# Patient Record
Sex: Female | Born: 1985
Health system: Southern US, Community
[De-identification: ages and names within clinical notes are randomized; demographics above are authoritative.]

## PROBLEM LIST (undated history)

## (undated) DIAGNOSIS — F419 Anxiety disorder, unspecified: Secondary | ICD-10-CM

## (undated) DIAGNOSIS — R002 Palpitations: Secondary | ICD-10-CM

## (undated) DIAGNOSIS — R079 Chest pain, unspecified: Secondary | ICD-10-CM

## (undated) DIAGNOSIS — F410 Panic disorder [episodic paroxysmal anxiety] without agoraphobia: Secondary | ICD-10-CM

## (undated) DIAGNOSIS — E876 Hypokalemia: Secondary | ICD-10-CM

## (undated) HISTORY — DX: Chest pain, unspecified: R07.9

## (undated) HISTORY — DX: Palpitations: R00.2

## (undated) HISTORY — DX: Hypokalemia: E87.6

---
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
## 2005-09-01 ENCOUNTER — Emergency Department: Payer: Self-pay | Admitting: Internal Medicine

## 2005-09-03 ENCOUNTER — Emergency Department: Payer: Self-pay | Admitting: Emergency Medicine

## 2005-09-04 ENCOUNTER — Ambulatory Visit: Payer: Self-pay | Admitting: Emergency Medicine

## 2005-12-16 ENCOUNTER — Emergency Department: Payer: Self-pay | Admitting: Emergency Medicine

## 2006-05-08 ENCOUNTER — Observation Stay: Payer: Self-pay | Admitting: Obstetrics & Gynecology

## 2006-08-15 ENCOUNTER — Observation Stay: Payer: Self-pay

## 2006-08-17 ENCOUNTER — Inpatient Hospital Stay: Payer: Self-pay

## 2007-01-15 LAB — HM HEPATITIS C SCREENING LAB: HM Hepatitis Screen: NEGATIVE

## 2008-02-13 ENCOUNTER — Emergency Department (HOSPITAL_COMMUNITY): Admission: EM | Admit: 2008-02-13 | Discharge: 2008-02-13 | Payer: Self-pay | Admitting: Emergency Medicine

## 2008-02-17 ENCOUNTER — Emergency Department: Payer: Self-pay | Admitting: Emergency Medicine

## 2009-02-01 ENCOUNTER — Emergency Department: Payer: Self-pay | Admitting: Emergency Medicine

## 2009-02-28 ENCOUNTER — Ambulatory Visit: Payer: Self-pay | Admitting: General Practice

## 2009-05-01 ENCOUNTER — Emergency Department: Payer: Self-pay | Admitting: Emergency Medicine

## 2009-08-26 ENCOUNTER — Emergency Department (HOSPITAL_COMMUNITY): Admission: EM | Admit: 2009-08-26 | Discharge: 2009-08-26 | Payer: Self-pay | Admitting: Emergency Medicine

## 2010-12-12 ENCOUNTER — Ambulatory Visit (INDEPENDENT_AMBULATORY_CARE_PROVIDER_SITE_OTHER): Payer: BC Managed Care – PPO | Admitting: Family Medicine

## 2010-12-12 ENCOUNTER — Encounter: Payer: Self-pay | Admitting: Family Medicine

## 2010-12-12 VITALS — BP 92/50 | HR 78 | Temp 98.2°F | Ht 66.0 in | Wt 190.5 lb

## 2010-12-12 DIAGNOSIS — R631 Polydipsia: Secondary | ICD-10-CM

## 2010-12-12 DIAGNOSIS — M62838 Other muscle spasm: Secondary | ICD-10-CM | POA: Insufficient documentation

## 2010-12-12 DIAGNOSIS — R350 Frequency of micturition: Secondary | ICD-10-CM | POA: Insufficient documentation

## 2010-12-12 DIAGNOSIS — R5383 Other fatigue: Secondary | ICD-10-CM

## 2010-12-12 DIAGNOSIS — Z0000 Encounter for general adult medical examination without abnormal findings: Secondary | ICD-10-CM

## 2010-12-12 DIAGNOSIS — R5381 Other malaise: Secondary | ICD-10-CM

## 2010-12-12 DIAGNOSIS — R102 Pelvic and perineal pain: Secondary | ICD-10-CM

## 2010-12-12 DIAGNOSIS — R109 Unspecified abdominal pain: Secondary | ICD-10-CM

## 2010-12-12 LAB — POCT URINALYSIS DIPSTICK
Bilirubin, UA: NEGATIVE
Blood, UA: NEGATIVE
Glucose, UA: NEGATIVE
Spec Grav, UA: 1.015

## 2010-12-12 MED ORDER — CYCLOBENZAPRINE HCL 5 MG PO TABS: 5.0000 mg | ORAL_TABLET | Freq: Three times a day (TID) | ORAL | Status: DC | PRN

## 2010-12-12 NOTE — Progress Notes (Signed)
Subjective:    Patient ID: Jillian Contreras, female    DOB: [birth year], 25 y.o.   MRN: [id number]  HPI  25 yo here to establish care with:  1.  Left sided neck/back and arm pain- no known injury.   Recently changed jobs.  Was a CNA, now working in Clinical biochemist at [employer].  Not used to working at a computer all day. No UE weakness.  No radiculopathy.  2.  Increased thirst/urination- at one point told she was prediabetic.  Has been working on diet.  Not sure if her parents have diabetes and they do not go to doctor often. No dysuria but she is having bilateral low back pain and some suprapubic pain. No vaginal discharge or vulvular irritation.   Has not been sexually active since March.    Review of Systems See HPI Patient reports no  vision/ hearing changes,anorexia, weight change, fever ,adenopathy, persistant / recurrent hoarseness, swallowing issues, chest pain, edema,persistant / recurrent cough, hemoptysis, dyspnea(rest, exertional, paroxysmal nocturnal), gastrointestinal  bleeding (melena, rectal bleeding), abdominal pain, excessive heart burn, GU symptoms(dysuria, hematuria, pyuria, voiding/incontinence  Issues) syncope, focal weakness, severe memory loss, concerning skin lesions, depression, anxiety, abnormal bruising/bleeding, major joint swelling, breast masses or abnormal vaginal bleeding.       Objective:   Physical Exam BP 92/50  Pulse 78  Temp(Src) 98.2 F (36.8 C) (Oral)  Ht 5\' 6"  (1.676 m)  Wt 190 lb 8 oz (86.41 kg)  BMI 30.75 kg/m2  LMP 12/01/2010  General:  Well-developed,well-nourished,in no acute distress; alert,appropriate and cooperative throughout examination Head:  normocephalic and atraumatic.   Eyes:  vision grossly intact, pupils equal, pupils round, and pupils reactive to light.   Ears:  R ear normal and L ear normal.   Nose:  no external deformity.   Mouth:  good dentition.   Neck:  No deformities, left trapezius tightness, FROM, no  masses or tenderness Lungs:  Normal respiratory effort, chest expands symmetrically. Lungs are clear to auscultation, no crackles or wheezes. Heart:  Normal rate and regular rhythm. S1 and S2 normal without gallop, murmur, click, rub or other extra sounds. Abdomen:  Bowel sounds positive,abdomen soft and non-tender without masses, organomegaly or hernias noted. Rectal:  no external abnormalities.   Genitalia:  Pelvic Exam:        External: normal female genitalia without lesions or masses        Vagina: normal without lesions or masses        Cervix: normal without lesions or masses        Adnexa: normal bimanual exam without masses or fullness        Uterus: normal by palpation      Msk:  No deformity or scoliosis noted of thoracic or lumbar spine.   Extremities:  No clubbing, cyanosis, edema, or deformity noted with normal full range of motion of all joints.   Neurologic:  alert & oriented X3 and gait normal.   Skin:  Intact without suspicious lesions or rashes Psych:  Cognition and judgment appear intact. Alert and cooperative with normal attention span and concentration. No apparent delusions, illusions, hallucinations    Assessment & Plan:   1. Trapezius muscle spasm   New.  Discussed stretches, try flexeril as needed.     2. Urinary frequency   UA negative.  Will check a1c, BMET today. HgB A1c, POCT urinalysis dipstick  3. Suprapubic pain   New and resolved.  UA neg, wet prep negative and no  tenderness with pelvic exam.  Will send GC/chlamydia probe. GC/chlamydia probe amp, genital  4. Fatigue   Likely related to job and life stressors, single mom of a 49 year old.   Will also check TSH and CBC to rule out other possible contributing factors.    TSH, CBC

## 2010-12-12 NOTE — Patient Instructions (Signed)
Good to meet you. We will call you with your lab results next week. Try the flexeril at night to see if that helps with your neck and back pain.

## 2010-12-12 NOTE — Progress Notes (Signed)
Addended by: Dianne Dun on: 12/12/2010 03:04 PM   Modules accepted: Orders

## 2010-12-13 LAB — GC/CHLAMYDIA PROBE AMP, GENITAL
Chlamydia, DNA Probe: NEGATIVE
GC Probe Amp, Genital: NEGATIVE

## 2010-12-13 LAB — HEMOGLOBIN A1C: Mean Plasma Glucose: 128 mg/dL — ABNORMAL HIGH (ref <117)

## 2010-12-13 LAB — BASIC METABOLIC PANEL
CO2: 23 mEq/L (ref 19–32)
Calcium: 8.7 mg/dL (ref 8.4–10.5)
Creat: 0.67 mg/dL (ref 0.50–1.10)
Glucose, Bld: 104 mg/dL — ABNORMAL HIGH (ref 70–99)
Sodium: 138 mEq/L (ref 135–145)

## 2010-12-13 LAB — TSH: TSH: 1.912 u[IU]/mL (ref 0.350–4.500)

## 2010-12-19 LAB — CBC WITH DIFFERENTIAL/PLATELET

## 2010-12-23 ENCOUNTER — Other Ambulatory Visit (INDEPENDENT_AMBULATORY_CARE_PROVIDER_SITE_OTHER): Payer: BC Managed Care – PPO

## 2010-12-23 DIAGNOSIS — R5381 Other malaise: Secondary | ICD-10-CM

## 2010-12-23 DIAGNOSIS — R5383 Other fatigue: Secondary | ICD-10-CM

## 2010-12-23 DIAGNOSIS — Z0000 Encounter for general adult medical examination without abnormal findings: Secondary | ICD-10-CM

## 2010-12-23 LAB — CBC WITH DIFFERENTIAL/PLATELET
Basophils Absolute: 0 10*3/uL (ref 0.0–0.1)
Basophils Relative: 0.3 % (ref 0.0–3.0)
Eosinophils Absolute: 0.1 10*3/uL (ref 0.0–0.7)
HCT: 35.5 % — ABNORMAL LOW (ref 36.0–46.0)
Hemoglobin: 11.8 g/dL — ABNORMAL LOW (ref 12.0–15.0)
Lymphs Abs: 2.4 10*3/uL (ref 0.7–4.0)
MCHC: 33.3 g/dL (ref 30.0–36.0)
Neutro Abs: 4.2 10*3/uL (ref 1.4–7.7)
RBC: 4.2 Mil/uL (ref 3.87–5.11)
RDW: 13.5 % (ref 11.5–14.6)

## 2010-12-23 LAB — COMPREHENSIVE METABOLIC PANEL
Albumin: 3.9
Alkaline Phosphatase: 93
BUN: 8
Calcium: 9.5
Potassium: 4.5
Sodium: 139
Total Protein: 6.7

## 2010-12-23 LAB — URINALYSIS, ROUTINE W REFLEX MICROSCOPIC
Bilirubin Urine: NEGATIVE
Nitrite: NEGATIVE
Protein, ur: NEGATIVE
Specific Gravity, Urine: 1.016
Urobilinogen, UA: 0.2

## 2010-12-23 LAB — CBC
HCT: 39.4
MCHC: 33.2
Platelets: 293
RDW: 12.6

## 2010-12-23 LAB — DIFFERENTIAL
Basophils Relative: 0
Lymphocytes Relative: 25
Lymphs Abs: 2.4
Monocytes Absolute: 0.5
Monocytes Relative: 5
Neutro Abs: 6.6
Neutrophils Relative %: 68

## 2010-12-23 LAB — POCT PREGNANCY, URINE: Preg Test, Ur: NEGATIVE

## 2011-04-23 ENCOUNTER — Encounter: Payer: Self-pay | Admitting: Family Medicine

## 2011-04-23 ENCOUNTER — Encounter: Payer: Self-pay | Admitting: *Deleted

## 2011-04-23 ENCOUNTER — Ambulatory Visit (INDEPENDENT_AMBULATORY_CARE_PROVIDER_SITE_OTHER): Payer: BC Managed Care – PPO | Admitting: Family Medicine

## 2011-04-23 VITALS — BP 120/70 | HR 87 | Temp 98.2°F | Wt 188.5 lb

## 2011-04-23 DIAGNOSIS — E162 Hypoglycemia, unspecified: Secondary | ICD-10-CM

## 2011-04-23 LAB — BASIC METABOLIC PANEL
BUN: 7 mg/dL (ref 6–23)
Calcium: 9.3 mg/dL (ref 8.4–10.5)
Creatinine, Ser: 0.7 mg/dL (ref 0.4–1.2)
GFR: 124.96 mL/min (ref >60.00)

## 2011-04-23 LAB — HEMOGLOBIN A1C: Hgb A1c MFr Bld: 5.8 % (ref 4.6–6.5)

## 2011-04-23 NOTE — Patient Instructions (Signed)
Good to see you. I will call you with your lab results tomorrow or Monday.

## 2011-04-23 NOTE — Progress Notes (Signed)
  Subjective:    Patient ID: Jillian Contreras, female    DOB: 18-Jul-1985, 26 y.o.   MRN: [id number]  HPI  26 yo here to "check if I have diabetes."  a1c was 6.1 in 11/2010.  For past several months, feels like her blood sugar is dropping. Gets very shaky.  When she eats or drinks something, feels better but very fatigued.  Not sure if she has a family h/o DM. Patient Active Problem List  Diagnoses  . Trapezius muscle spasm  . Urinary frequency  . Suprapubic pain  . Increased thirst  . Low blood sugar   No past medical history on file. Past Surgical History  Procedure Date  . Cesarean section    History  Substance Use Topics  . Smoking status: Never Smoker   . Smokeless tobacco: Not on file  . Alcohol Use: Not on file   No family history on file. No Known Allergies Current Outpatient Prescriptions on File Prior to Visit  Medication Sig Dispense Refill  . cyclobenzaprine (FLEXERIL) 5 MG tablet Take 1 tablet (5 mg total) by mouth every 8 (eight) hours as needed for muscle spasms.  30 tablet  1   The PMH, PSH, Social History, Family History, Medications, and allergies have been reviewed in [facility], and have been updated if relevant.  Review of Systems See HPI No increased urinary frequency or increased thirst    Objective:   Physical Exam BP 120/70  Pulse 87  Temp(Src) 98.2 F (36.8 C) (Oral)  Wt 188 lb 8 oz (85.503 kg)  LMP 04/12/2010  General:  Well-developed,well-nourished,in no acute distress; alert,appropriate and cooperative throughout examination Head:  normocephalic and atraumatic.   Lungs:  Normal respiratory effort, chest expands symmetrically. Lungs are clear to auscultation, no crackles or wheezes. Heart:  Normal rate and regular rhythm. S1 and S2 normal without gallop, murmur, click, rub or other extra sounds. Abdomen:  Bowel sounds positive,abdomen soft and non-tender without masses, organomegaly or hernias noted. Msk:  No deformity or scoliosis noted of  thoracic or lumbar spine.   Extremities:  No clubbing, cyanosis, edema, or deformity noted with normal full range of motion of all joints.   Neurologic:  alert & oriented X3 and gait normal.   Skin:  Intact without suspicious lesions or rashes Psych:  Cognition and judgment appear intact. Alert and cooperative with normal attention span and concentration. No apparent delusions, illusions, hallucinations    Assessment & Plan:   1. Low blood sugar  HgB A1c, Basic Metabolic Panel (BMET)   Deteriorated. Recheck labs today. The patient indicates understanding of these issues and agrees with the plan.

## 2011-04-28 ENCOUNTER — Encounter: Payer: Self-pay | Admitting: Family Medicine

## 2011-04-28 ENCOUNTER — Ambulatory Visit (INDEPENDENT_AMBULATORY_CARE_PROVIDER_SITE_OTHER): Payer: BC Managed Care – PPO | Admitting: Family Medicine

## 2011-04-28 VITALS — BP 110/76 | HR 66 | Temp 98.4°F | Wt 187.8 lb

## 2011-04-28 DIAGNOSIS — J069 Acute upper respiratory infection, unspecified: Secondary | ICD-10-CM

## 2011-04-28 MED ORDER — HYDROCOD POLST-CHLORPHEN POLST 10-8 MG/5ML PO LQCR: 5.0000 mL | Freq: Every evening | ORAL | Status: DC | PRN

## 2011-04-28 MED ORDER — GUAIFENESIN ER 600 MG PO TB12: 600.0000 mg | ORAL_TABLET | Freq: Two times a day (BID) | ORAL | Status: DC

## 2011-04-28 NOTE — Progress Notes (Signed)
SUBJECTIVE:  Jillian Contreras is a 26 y.o. female who complains of coryza, congestion, dry cough and hoarseness for 3 days. She denies a history of anorexia, chest pain, chills, fevers, nausea, sweats and vomiting and denies a history of asthma. Patient denies smoke cigarettes.   Patient Active Problem List  Diagnoses  . Trapezius muscle spasm  . Urinary frequency  . Suprapubic pain  . Increased thirst  . Low blood sugar   No past medical history on file. Past Surgical History  Procedure Date  . Cesarean section    History  Substance Use Topics  . Smoking status: Never Smoker   . Smokeless tobacco: Not on file  . Alcohol Use: Not on file   No family history on file. No Known Allergies No current outpatient prescriptions on file prior to visit.   The PMH, PSH, Social History, Family History, Medications, and allergies have been reviewed in [facility], and have been updated if relevant.  OBJECTIVE: BP 110/76  Pulse 66  Temp(Src) 98.4 F (36.9 C) (Oral)  Wt 187 lb 12 oz (85.163 kg)  LMP 04/12/2010  She appears well, vital signs are as noted. Ears normal.  Throat and pharynx normal.  Neck supple. No adenopathy in the neck. Nose is congested. Sinuses non tender. The chest is clear, without wheezes or rales.  ASSESSMENT:  viral upper respiratory illness  PLAN: Symptomatic therapy suggested: push fluids, rest and return office visit prn if symptoms persist or worsen. Lack of antibiotic effectiveness discussed with her. Call or return to clinic prn if these symptoms worsen or fail to improve as anticipated.

## 2011-04-28 NOTE — Patient Instructions (Signed)
Drink lots of fluids.  Treat sympotmatically with Mucinex, nasal saline irrigation, and Tylenol/Ibuprofen. A  Cough suppressant at night. Call if not improving as expected in 5-7 days.

## 2011-06-22 ENCOUNTER — Encounter: Payer: BC Managed Care – PPO | Admitting: Family Medicine

## 2011-06-23 ENCOUNTER — Encounter: Payer: BC Managed Care – PPO | Admitting: Family Medicine

## 2011-08-20 ENCOUNTER — Ambulatory Visit (INDEPENDENT_AMBULATORY_CARE_PROVIDER_SITE_OTHER): Payer: BC Managed Care – PPO | Admitting: Family Medicine

## 2011-08-20 ENCOUNTER — Other Ambulatory Visit (HOSPITAL_COMMUNITY)
Admission: RE | Admit: 2011-08-20 | Discharge: 2011-08-20 | Disposition: A | Discharge status: Home / Self Care | Payer: BC Managed Care – PPO | Source: Ambulatory Visit | Attending: Family Medicine | Admitting: Family Medicine

## 2011-08-20 ENCOUNTER — Encounter: Payer: Self-pay | Admitting: Family Medicine

## 2011-08-20 VITALS — BP 118/80 | HR 60 | Temp 98.0°F | Ht 65.25 in | Wt 180.0 lb

## 2011-08-20 DIAGNOSIS — Z0000 Encounter for general adult medical examination without abnormal findings: Secondary | ICD-10-CM | POA: Insufficient documentation

## 2011-08-20 DIAGNOSIS — Z01419 Encounter for gynecological examination (general) (routine) without abnormal findings: Secondary | ICD-10-CM | POA: Insufficient documentation

## 2011-08-20 LAB — COMPREHENSIVE METABOLIC PANEL
AST: 22 U/L (ref 0–37)
Albumin: 4.1 g/dL (ref 3.5–5.2)
Alkaline Phosphatase: 70 U/L (ref 39–117)
Potassium: 3.8 mEq/L (ref 3.5–5.1)
Sodium: 139 mEq/L (ref 135–145)
Total Bilirubin: 0.5 mg/dL (ref 0.3–1.2)
Total Protein: 7.6 g/dL (ref 6.0–8.3)

## 2011-08-20 LAB — LDL CHOLESTEROL, DIRECT: Direct LDL: 149.6 mg/dL

## 2011-08-20 LAB — CBC WITH DIFFERENTIAL/PLATELET
Basophils Absolute: 0 10*3/uL (ref 0.0–0.1)
Eosinophils Relative: 1.4 % (ref 0.0–5.0)
HCT: 37.2 % (ref 36.0–46.0)
Hemoglobin: 12.3 g/dL (ref 12.0–15.0)
Lymphocytes Relative: 34.6 % (ref 12.0–46.0)
Lymphs Abs: 2 10*3/uL (ref 0.7–4.0)
Monocytes Relative: 6.4 % (ref 3.0–12.0)
Neutro Abs: 3.3 10*3/uL (ref 1.4–7.7)
Platelets: 286 10*3/uL (ref 150.0–400.0)
RDW: 13.8 % (ref 11.5–14.6)
WBC: 5.7 10*3/uL (ref 4.5–10.5)

## 2011-08-20 LAB — LIPID PANEL
Total CHOL/HDL Ratio: 4
VLDL: 16.4 mg/dL (ref 0.0–40.0)

## 2011-08-20 NOTE — Patient Instructions (Signed)
Great to see you. We will call you with your lab results and likely send you a letter with your pap smear results.  Health Maintenance, Females A healthy lifestyle and preventative care can promote health and wellness.  Maintain regular health, dental, and eye exams.   Eat a healthy diet. Foods like vegetables, fruits, whole grains, low-fat dairy products, and lean protein foods contain the nutrients you need without too many calories. Decrease your intake of foods high in solid fats, added sugars, and salt. Get information about a proper diet from your caregiver, if necessary.   Regular physical exercise is one of the most important things you can do for your health. Most adults should get at least 150 minutes of moderate-intensity exercise (any activity that increases your heart rate and causes you to sweat) each week. In addition, most adults need muscle-strengthening exercises on 2 or more days a week.    Maintain a healthy weight. The body mass index (BMI) is a screening tool to identify possible weight problems. It provides an estimate of body fat based on height and weight. Your caregiver can help determine your BMI, and can help you achieve or maintain a healthy weight. For adults 20 years and older:   A BMI below 18.5 is considered underweight.   A BMI of 18.5 to 24.9 is normal.   A BMI of 25 to 29.9 is considered overweight.   A BMI of 30 and above is considered obese.   Maintain normal blood lipids and cholesterol by exercising and minimizing your intake of saturated fat. Eat a balanced diet with plenty of fruits and vegetables. Blood tests for lipids and cholesterol should begin at age 4 and be repeated every 5 years. If your lipid or cholesterol levels are high, you are over 50, or you are a high risk for heart disease, you may need your cholesterol levels checked more frequently.Ongoing high lipid and cholesterol levels should be treated with medicines if diet and exercise are  not effective.   If you smoke, find out from your caregiver how to quit. If you do not use tobacco, do not start.   If you are pregnant, do not drink alcohol. If you are breastfeeding, be very cautious about drinking alcohol. If you are not pregnant and choose to drink alcohol, do not exceed 1 drink per day. One drink is considered to be 12 ounces (355 mL) of beer, 5 ounces (148 mL) of wine, or 1.5 ounces (44 mL) of liquor.   Avoid use of street drugs. Do not share needles with anyone. Ask for help if you need support or instructions about stopping the use of drugs.   High blood pressure causes heart disease and increases the risk of stroke. Blood pressure should be checked at least every 1 to 2 years. Ongoing high blood pressure should be treated with medicines, if weight loss and exercise are not effective.   If you are 39 to 27 years old, ask your caregiver if you should take aspirin to prevent strokes.   Diabetes screening involves taking a blood sample to check your fasting blood sugar level. This should be done once every 3 years, after age 23, if you are within normal weight and without risk factors for diabetes. Testing should be considered at a younger age or be carried out more frequently if you are overweight and have at least 1 risk factor for diabetes.   Breast cancer screening is essential preventative care for women. You should practice "  breast self-awareness." This means understanding the normal appearance and feel of your breasts and may include breast self-examination. Any changes detected, no matter how small, should be reported to a caregiver. Women in their 23s and 30s should have a clinical breast exam (CBE) by a caregiver as part of a regular health exam every 1 to 3 years. After age 15, women should have a CBE every year. Starting at age 41, women should consider having a mammogram (breast X-ray) every year. Women who have a family history of breast cancer should talk to their  caregiver about genetic screening. Women at a high risk of breast cancer should talk to their caregiver about having an MRI and a mammogram every year.   The Pap test is a screening test for cervical cancer. Women should have a Pap test starting at age 25. Between ages 65 and 47, Pap tests should be repeated every 2 years. Beginning at age 81, you should have a Pap test every 3 years as long as the past 3 Pap tests have been normal. If you had a hysterectomy for a problem that was not cancer or a condition that could lead to cancer, then you no longer need Pap tests. If you are between ages 63 and 42, and you have had normal Pap tests going back 10 years, you no longer need Pap tests. If you have had past treatment for cervical cancer or a condition that could lead to cancer, you need Pap tests and screening for cancer for at least 20 years after your treatment. If Pap tests have been discontinued, risk factors (such as a new sexual partner) need to be reassessed to determine if screening should be resumed. Some women have medical problems that increase the chance of getting cervical cancer. In these cases, your caregiver may recommend more frequent screening and Pap tests.   The human papillomavirus (HPV) test is an additional test that may be used for cervical cancer screening. The HPV test looks for the virus that can cause the cell changes on the cervix. The cells collected during the Pap test can be tested for HPV. The HPV test could be used to screen women aged 76 years and older, and should be used in women of any age who have unclear Pap test results. After the age of 80, women should have HPV testing at the same frequency as a Pap test.   Colorectal cancer can be detected and often prevented. Most routine colorectal cancer screening begins at the age of 31 and continues through age 37. However, your caregiver may recommend screening at an earlier age if you have risk factors for colon cancer. On a  yearly basis, your caregiver may provide home test kits to check for hidden blood in the stool. Use of a small camera at the end of a tube, to directly examine the colon (sigmoidoscopy or colonoscopy), can detect the earliest forms of colorectal cancer. Talk to your caregiver about this at age 74, when routine screening begins. Direct examination of the colon should be repeated every 5 to 10 years through age 24, unless early forms of pre-cancerous polyps or small growths are found.   Hepatitis C blood testing is recommended for all people born from [birth year] through 1966 and any individual with known risks for hepatitis C.   Practice safe sex. Use condoms and avoid high-risk sexual practices to reduce the spread of sexually transmitted infections (STIs). Sexually active women aged 72 and younger should be checked for  Chlamydia, which is a common sexually transmitted infection. Older women with new or multiple partners should also be tested for Chlamydia. Testing for other STIs is recommended if you are sexually active and at increased risk.   Osteoporosis is a disease in which the bones lose minerals and strength with aging. This can result in serious bone fractures. The risk of osteoporosis can be identified using a bone density scan. Women ages 6 and over and women at risk for fractures or osteoporosis should discuss screening with their caregivers. Ask your caregiver whether you should be taking a calcium supplement or vitamin D to reduce the rate of osteoporosis.   Menopause can be associated with physical symptoms and risks. Hormone replacement therapy is available to decrease symptoms and risks. You should talk to your caregiver about whether hormone replacement therapy is right for you.   Use sunscreen with a sun protection factor (SPF) of 30 or greater. Apply sunscreen liberally and repeatedly throughout the day. You should seek shade when your shadow is shorter than you. Protect yourself by wearing  long sleeves, pants, a wide-brimmed hat, and sunglasses year round, whenever you are outdoors.   Notify your caregiver of new moles or changes in moles, especially if there is a change in shape or color. Also notify your caregiver if a mole is larger than the size of a pencil eraser.   Stay current with your immunizations.  Document Released: 09/22/2010 Document Revised: 02/26/2011 Document Reviewed: 09/22/2010 [facility] Patient Information 2012 [place], [place].

## 2011-08-20 NOTE — Progress Notes (Signed)
Subjective:    Patient ID: Jillian Contreras, female    DOB: [birth year], 26 y.o.   MRN: [id number]  HPI  26 yo here for CPX.  Has not been sexually active in over a year.  Doing well, trying to loose weight with diet and exercise.  Wt Readings from Last 3 Encounters:  08/20/11 180 lb (81.647 kg)  04/28/11 187 lb 12 oz (85.163 kg)  04/23/11 188 lb 8 oz (85.503 kg)   Patient Active Problem List  Diagnoses  . Trapezius muscle spasm  . Urinary frequency  . Suprapubic pain  . Increased thirst  . Low blood sugar  . Routine general medical examination at a health care facility  . Gynecological examination   No past medical history on file. Past Surgical History  Procedure Date  . Cesarean section    History  Substance Use Topics  . Smoking status: Never Smoker   . Smokeless tobacco: Not on file  . Alcohol Use: Not on file   Family History  Problem Relation Age of Onset  . Cancer Paternal Aunt 29    breast   No Known Allergies No current outpatient prescriptions on file prior to visit.   The PMH, PSH, Social History, Family History, Medications, and allergies have been reviewed in [facility], and have been updated if relevant.    Review of Systems    See HPI Patient reports no  vision/ hearing changes,anorexia, weight change, fever ,adenopathy, persistant / recurrent hoarseness, swallowing issues, chest pain, edema,persistant / recurrent cough, hemoptysis, dyspnea(rest, exertional, paroxysmal nocturnal), gastrointestinal  bleeding (melena, rectal bleeding), abdominal pain, excessive heart burn, GU symptoms(dysuria, hematuria, pyuria, voiding/incontinence  Issues) syncope, focal weakness, severe memory loss, concerning skin lesions, depression, anxiety, abnormal bruising/bleeding, major joint swelling, breast masses or abnormal vaginal bleeding.    Objective:   Physical Exam BP 118/80  Pulse 60  Temp(Src) 98 F (36.7 C) (Oral)  Ht 5' 5.25" (1.657 m)  Wt 180 lb (81.647 kg)   BMI 29.72 kg/m2  LMP 07/27/2011  General:  Well-developed,well-nourished,in no acute distress; alert,appropriate and cooperative throughout examination Head:  normocephalic and atraumatic.   Eyes:  vision grossly intact, pupils equal, pupils round, and pupils reactive to light.   Ears:  R ear normal and L ear normal.   Nose:  no external deformity.   Mouth:  good dentition.   Neck:  No deformities, masses, or tenderness noted. Breasts:  No mass, nodules, thickening, tenderness, bulging, retraction, inflamation, nipple discharge or skin changes noted.   Lungs:  Normal respiratory effort, chest expands symmetrically. Lungs are clear to auscultation, no crackles or wheezes. Heart:  Normal rate and regular rhythm. S1 and S2 normal without gallop, murmur, click, rub or other extra sounds. Abdomen:  Bowel sounds positive,abdomen soft and non-tender without masses, organomegaly or hernias noted. Rectal:  no external abnormalities.   Genitalia:  Pelvic Exam:        External: normal female genitalia without lesions or masses        Vagina: normal without lesions or masses        Cervix: normal without lesions or masses        Adnexa: normal bimanual exam without masses or fullness        Uterus: normal by palpation        Pap smear: performed Msk:  No deformity or scoliosis noted of thoracic or lumbar spine.   Extremities:  No clubbing, cyanosis, edema, or deformity noted with normal full range of motion  of all joints.   Neurologic:  alert & oriented X3 and gait normal.   Skin:  Intact without suspicious lesions or rashes Cervical Nodes:  No lymphadenopathy noted Axillary Nodes:  No palpable lymphadenopathy Psych:  Cognition and judgment appear intact. Alert and cooperative with normal attention span and concentration. No apparent delusions, illusions, hallucinations     Assessment & Plan:   1. Routine general medical examination at a health care facility  Reviewed preventive care  protocols, scheduled due services, and updated immunizations Discussed nutrition, exercise, diet, and healthy lifestyle.  Comprehensive metabolic panel, Lipid Panel, CBC with Differential, Cytology - PAP  2. Gynecological examination  Cytology - PAP

## 2011-08-24 ENCOUNTER — Encounter: Payer: Self-pay | Admitting: Family Medicine

## 2011-08-24 ENCOUNTER — Encounter: Payer: Self-pay | Admitting: *Deleted

## 2011-10-24 ENCOUNTER — Emergency Department: Payer: Self-pay | Admitting: Unknown Physician Specialty

## 2011-10-24 ENCOUNTER — Ambulatory Visit: Payer: Self-pay | Admitting: Internal Medicine

## 2011-10-24 LAB — COMPREHENSIVE METABOLIC PANEL
Alkaline Phosphatase: 104 U/L (ref 50–136)
Anion Gap: 11 (ref 7–16)
BUN: 10 mg/dL (ref 7–18)
Bilirubin,Total: 0.4 mg/dL (ref 0.2–1.0)
Chloride: 106 mmol/L (ref 98–107)
Co2: 23 mmol/L (ref 21–32)
Creatinine: 0.79 mg/dL (ref 0.60–1.30)
EGFR (African American): >60
EGFR (Non-African Amer.): >60
Potassium: 4 mmol/L (ref 3.5–5.1)
SGPT (ALT): 35 U/L (ref 12–78)
Total Protein: 8.4 g/dL — ABNORMAL HIGH (ref 6.4–8.2)

## 2011-10-24 LAB — CBC WITH DIFFERENTIAL/PLATELET
Basophil #: 0.1 10*3/uL (ref 0.0–0.1)
Eosinophil #: 0 10*3/uL (ref 0.0–0.7)
HCT: 35.9 % (ref 35.0–47.0)
Lymphocyte #: 2 10*3/uL (ref 1.0–3.6)
Lymphocyte %: 15.1 %
MCH: 27 pg (ref 26.0–34.0)
MCHC: 32.5 g/dL (ref 32.0–36.0)
MCV: 83 fL (ref 80–100)
Monocyte #: 1.3 x10 3/mm — ABNORMAL HIGH (ref 0.2–0.9)
Neutrophil #: 9.9 10*3/uL — ABNORMAL HIGH (ref 1.4–6.5)
Platelet: 315 10*3/uL (ref 150–440)
RDW: 13.9 % (ref 11.5–14.5)
WBC: 13.3 10*3/uL — ABNORMAL HIGH (ref 3.6–11.0)

## 2011-10-24 LAB — URINALYSIS, COMPLETE
Bacteria: NONE SEEN
Bilirubin,UR: NEGATIVE
Blood: NEGATIVE
Glucose,UR: NEGATIVE mg/dL (ref 0–75)
Ketone: NEGATIVE
Ph: 6 (ref 4.5–8.0)
Protein: 30
RBC,UR: 6 /HPF (ref 0–5)
Specific Gravity: 1.024 (ref 1.003–1.030)
Squamous Epithelial: 2
WBC UR: 5 /HPF (ref 0–5)

## 2011-10-24 LAB — HCG, QUANTITATIVE, PREGNANCY: Beta Hcg, Quant.: <1 m[IU]/mL — ABNORMAL LOW

## 2011-10-24 LAB — PREGNANCY, URINE: Pregnancy Test, Urine: NEGATIVE m[IU]/mL

## 2011-10-30 LAB — CULTURE, BLOOD (SINGLE)

## 2012-05-04 ENCOUNTER — Encounter: Payer: Self-pay | Admitting: *Deleted

## 2012-05-04 ENCOUNTER — Encounter: Payer: Self-pay | Admitting: Family Medicine

## 2012-05-04 ENCOUNTER — Ambulatory Visit (INDEPENDENT_AMBULATORY_CARE_PROVIDER_SITE_OTHER): Payer: BC Managed Care – PPO | Admitting: Family Medicine

## 2012-05-04 ENCOUNTER — Ambulatory Visit: Payer: BC Managed Care – PPO | Admitting: Family Medicine

## 2012-05-04 VITALS — BP 116/78 | HR 82 | Temp 98.7°F | Wt 193.0 lb

## 2012-05-04 DIAGNOSIS — J069 Acute upper respiratory infection, unspecified: Secondary | ICD-10-CM | POA: Insufficient documentation

## 2012-05-04 MED ORDER — HYDROCOD POLST-CHLORPHEN POLST 10-8 MG/5ML PO LQCR: 5.0000 mL | Freq: Every evening | ORAL | Status: DC | PRN

## 2012-05-04 NOTE — Progress Notes (Signed)
  Subjective:    Patient ID: Jillian Contreras, female    DOB: May 01, 1985, 27 y.o.   MRN: [id number]  HPI CC: URI sxs  Over weekend started with ST (5d ago), progressing to cough, hoarse voice, frontal pressure HA (which is now better).  Mild head and chest congestion.  Cough worse with prolonged talking.  Continued coughing all day and night - trouble sleeping at night 2/2 cough.  PNdrainage.  Coughing fits.  So far has tried tylenol cold, codeine cough syrup.  No fevers/chills, abd pain, nausea, ear or tooth pain.  Son recently sick last week. No h/o asthma. No smokers at home.  Did have cold 2 wks ago.  History reviewed. No pertinent past medical history.   Review of Systems Per HPI    Objective:   Physical Exam  Nursing note and vitals reviewed. Constitutional: She appears well-developed and well-nourished. No distress.  HENT:  Head: Normocephalic and atraumatic.  Right Ear: Hearing, tympanic membrane, external ear and ear canal normal.  Left Ear: Hearing, tympanic membrane, external ear and ear canal normal.  Nose: Mucosal edema present. No rhinorrhea. Right sinus exhibits no maxillary sinus tenderness and no frontal sinus tenderness. Left sinus exhibits no maxillary sinus tenderness and no frontal sinus tenderness.  Mouth/Throat: Uvula is midline, oropharynx is clear and moist and mucous membranes are normal. No oropharyngeal exudate, posterior oropharyngeal edema, posterior oropharyngeal erythema or tonsillar abscesses.  nasal mucosal irritation  Eyes: Conjunctivae and EOM are normal. Pupils are equal, round, and reactive to light. No scleral icterus.  Neck: Normal range of motion. Neck supple.  Cardiovascular: Normal rate, regular rhythm, normal heart sounds and intact distal pulses.   No murmur heard. Pulmonary/Chest: Effort normal and breath sounds normal. No respiratory distress. She has no wheezes. She has no rales.  Lymphadenopathy:    She has no cervical adenopathy.   Skin: Skin is warm and dry. No rash noted.       Assessment & Plan:

## 2012-05-04 NOTE — Patient Instructions (Signed)
Sounds like you have an upper respiratory infection, likely viral. Antibiotics are not needed for this.  Viral infections usually take 7-10 days to resolve.  The cough can last several weeks to go away. Use medication as prescribed: tussionex cough syrup for cough at night. Push fluids and plenty of rest. Simple mucinex or immediate release guaifenesin with plenty of water to mobilize mucous. Please return if you are not improving as expected, or if you have high fevers (>101.5) or difficulty swallowing or worsening productive cough. Call clinic with questions.  Good to see you today.

## 2012-05-04 NOTE — Assessment & Plan Note (Signed)
Anticipate viral given duration and story. Supportive care as per instructions. Pt agrees with plan. sxs that would cause concern for bacterial infection discussed.

## 2012-05-13 ENCOUNTER — Ambulatory Visit: Payer: BC Managed Care – PPO | Admitting: Family Medicine

## 2012-09-07 ENCOUNTER — Other Ambulatory Visit (HOSPITAL_COMMUNITY)
Admission: RE | Admit: 2012-09-07 | Discharge: 2012-09-07 | Disposition: A | Discharge status: Home / Self Care | Payer: BC Managed Care – PPO | Source: Ambulatory Visit | Attending: Family Medicine | Admitting: Family Medicine

## 2012-09-07 ENCOUNTER — Encounter: Payer: Self-pay | Admitting: Family Medicine

## 2012-09-07 ENCOUNTER — Ambulatory Visit (INDEPENDENT_AMBULATORY_CARE_PROVIDER_SITE_OTHER): Payer: BC Managed Care – PPO | Admitting: Family Medicine

## 2012-09-07 VITALS — BP 120/70 | HR 72 | Temp 97.9°F | Ht 65.0 in | Wt 198.0 lb

## 2012-09-07 DIAGNOSIS — Z0000 Encounter for general adult medical examination without abnormal findings: Secondary | ICD-10-CM

## 2012-09-07 DIAGNOSIS — Z01419 Encounter for gynecological examination (general) (routine) without abnormal findings: Secondary | ICD-10-CM

## 2012-09-07 LAB — CBC WITH DIFFERENTIAL/PLATELET
Basophils Absolute: 0 10*3/uL (ref 0.0–0.1)
Basophils Relative: 0.2 % (ref 0.0–3.0)
Eosinophils Relative: 1.3 % (ref 0.0–5.0)
HCT: 35 % — ABNORMAL LOW (ref 36.0–46.0)
Hemoglobin: 11.6 g/dL — ABNORMAL LOW (ref 12.0–15.0)
Lymphocytes Relative: 31.6 % (ref 12.0–46.0)
Lymphs Abs: 2 10*3/uL (ref 0.7–4.0)
Monocytes Relative: 6.6 % (ref 3.0–12.0)
Neutro Abs: 3.9 10*3/uL (ref 1.4–7.7)
RBC: 4.19 Mil/uL (ref 3.87–5.11)
RDW: 14.8 % — ABNORMAL HIGH (ref 11.5–14.6)
WBC: 6.5 10*3/uL (ref 4.5–10.5)

## 2012-09-07 LAB — COMPREHENSIVE METABOLIC PANEL
ALT: 21 U/L (ref 0–35)
AST: 19 U/L (ref 0–37)
Albumin: 4.1 g/dL (ref 3.5–5.2)
Alkaline Phosphatase: 64 U/L (ref 39–117)
BUN: 10 mg/dL (ref 6–23)
Calcium: 9 mg/dL (ref 8.4–10.5)
Chloride: 103 mEq/L (ref 96–112)
Potassium: 3.5 mEq/L (ref 3.5–5.1)
Sodium: 139 mEq/L (ref 135–145)
Total Protein: 7.6 g/dL (ref 6.0–8.3)

## 2012-09-07 LAB — LIPID PANEL
LDL Cholesterol: 117 mg/dL — ABNORMAL HIGH (ref 0–99)
Total CHOL/HDL Ratio: 4

## 2012-09-07 NOTE — Patient Instructions (Addendum)
Great to see you. Sign up for mychart so you can see your labs online and communicate easily with [place].  If you do not wish to sign up for it, we will call you.  Take care!

## 2012-09-07 NOTE — Addendum Note (Signed)
Addended by: Eliezer Bottom on: 09/07/2012 08:42 AM   Modules accepted: Orders

## 2012-09-07 NOTE — Progress Notes (Signed)
Subjective:    Patient ID: Jillian Contreras, female    DOB: Jun 22, 1985, 27 y.o.   MRN: [id number]  HPI  27 yo pleasant female here for CPX.  Has not been sexually active in over two years.  Pap smear neg for malignancy last year but transformation zone absent so I asked her to come back in to have it repeated.  Doing well, trying to loose weight with diet and exercise but has gained weight.  She feels she is now motivated to take better care of herself.  Otherwise doing very well and without complaints other than some joint pains after exercise.  Wt Readings from Last 3 Encounters:  05/04/12 193 lb (87.544 kg)  08/20/11 180 lb (81.647 kg)  04/28/11 187 lb 12 oz (85.163 kg)   Patient Active Problem List   Diagnosis Date Noted  . Routine general medical examination at a health care facility 08/20/2011  . Gynecological examination 08/20/2011   No past medical history on file. Past Surgical History  Procedure Laterality Date  . Cesarean section     History  Substance Use Topics  . Smoking status: Never Smoker   . Smokeless tobacco: Not on file  . Alcohol Use: No   Family History  Problem Relation Age of Onset  . Cancer Paternal Aunt 51    breast   No Known Allergies Current Outpatient Prescriptions on File Prior to Visit  Medication Sig Dispense Refill  . chlorpheniramine-HYDROcodone (TUSSIONEX) 10-8 MG/5ML LQCR Take 5 mLs by mouth at bedtime as needed. Sedation precautions  180 mL  0   No current facility-administered medications on file prior to visit.   The PMH, PSH, Social History, Family History, Medications, and allergies have been reviewed in [facility], and have been updated if relevant.    Review of Systems    See HPI Patient reports no  vision/ hearing changes,anorexia, weight change, fever ,adenopathy, persistant / recurrent hoarseness, swallowing issues, chest pain, edema,persistant / recurrent cough, hemoptysis, dyspnea(rest, exertional, paroxysmal nocturnal),  gastrointestinal  bleeding (melena, rectal bleeding), abdominal pain, excessive heart burn, GU symptoms(dysuria, hematuria, pyuria, voiding/incontinence  Issues) syncope, focal weakness, severe memory loss, concerning skin lesions, depression, anxiety, abnormal bruising/bleeding, major joint swelling, breast masses or abnormal vaginal bleeding.    Objective:   Physical Exam BP 120/70  Pulse 72  Temp(Src) 97.9 F (36.6 C)  Ht 5\' 5"  (1.651 m)  Wt 198 lb (89.812 kg)  BMI 32.95 kg/m2 Wt Readings from Last 3 Encounters:  09/07/12 198 lb (89.812 kg)  05/04/12 193 lb (87.544 kg)  08/20/11 180 lb (81.647 kg)    General:  Well-developed,well-nourished,in no acute distress; alert,appropriate and cooperative throughout examination Head:  normocephalic and atraumatic.   Eyes:  vision grossly intact, pupils equal, pupils round, and pupils reactive to light.   Ears:  R ear normal and L ear normal.   Nose:  no external deformity.   Mouth:  good dentition.   Neck:  No deformities, masses, or tenderness noted. Breasts:  No mass, nodules, thickening, tenderness, bulging, retraction, inflamation, nipple discharge or skin changes noted.   Lungs:  Normal respiratory effort, chest expands symmetrically. Lungs are clear to auscultation, no crackles or wheezes. Heart:  Normal rate and regular rhythm. S1 and S2 normal without gallop, murmur, click, rub or other extra sounds. Abdomen:  Bowel sounds positive,abdomen soft and non-tender without masses, organomegaly or hernias noted. Rectal:  no external abnormalities.   Genitalia:  Pelvic Exam:  External: normal female genitalia without lesions or masses        Vagina: normal without lesions or masses        Cervix: normal without lesions or masses        Adnexa: normal bimanual exam without masses or fullness        Uterus: normal by palpation        Pap smear: performed Msk:  No deformity or scoliosis noted of thoracic or lumbar spine.    Extremities:  No clubbing, cyanosis, edema, or deformity noted with normal full range of motion of all joints.   Neurologic:  alert & oriented X3 and gait normal.   Skin:  Intact without suspicious lesions or rashes Cervical Nodes:  No lymphadenopathy noted Axillary Nodes:  No palpable lymphadenopathy Psych:  Cognition and judgment appear intact. Alert and cooperative with normal attention span and concentration. No apparent delusions, illusions, hallucinations     Assessment & Plan:   1. Routine general medical examination at a health care facility Reviewed preventive care protocols, scheduled due services, and updated immunizations Discussed nutrition, exercise, diet, and healthy lifestyle.  - Comprehensive metabolic panel - Lipid Panel - CBC with Differential  2. Encounter for routine gynecological examination Pap today. No STD screening since she is not sexually active.

## 2012-09-09 ENCOUNTER — Encounter: Payer: Self-pay | Admitting: Family Medicine

## 2012-11-22 ENCOUNTER — Encounter: Payer: Self-pay | Admitting: Internal Medicine

## 2012-11-22 ENCOUNTER — Ambulatory Visit (INDEPENDENT_AMBULATORY_CARE_PROVIDER_SITE_OTHER): Payer: BC Managed Care – PPO | Admitting: Internal Medicine

## 2012-11-22 VITALS — BP 116/82 | HR 69 | Temp 98.1°F | Wt 199.2 lb

## 2012-11-22 DIAGNOSIS — J029 Acute pharyngitis, unspecified: Secondary | ICD-10-CM

## 2012-11-22 DIAGNOSIS — B9689 Other specified bacterial agents as the cause of diseases classified elsewhere: Secondary | ICD-10-CM

## 2012-11-22 MED ORDER — AMOXICILLIN 500 MG PO CAPS: 500.0000 mg | ORAL_CAPSULE | Freq: Three times a day (TID) | ORAL | Status: DC

## 2012-11-22 NOTE — Patient Instructions (Signed)

## 2012-11-22 NOTE — Addendum Note (Signed)
Addended by: Alvina Chou on: 11/22/2012 02:42 PM   Modules accepted: Orders

## 2012-11-22 NOTE — Progress Notes (Signed)
HPI  Pt presents to the clinic today with c/o sore throat, body aches, headache for the past 5 days. She went to urgent care 3 days for the same. They did a strep test which cam back negative. They told her that she had a viral infection but her symptoms have only gotten worse since that time. The symptoms seem to be better in the morning but worse throughout the day. She then developed a cough yesterday. It is productive, with a thin white sputum. She denies fever, chills, nausea or vomiting. She has taken tylenol cold and flu, benadryl and ibuprofen. She has not had sick contacts.  She has no history of allergies or asthma.  Review of Systems     History reviewed. No pertinent past medical history.  Family History  Problem Relation Age of Onset  . Cancer Paternal Aunt 40    breast    History   Social History  . Marital Status: Single    Spouse Name: N/A    Number of Children: N/A  . Years of Education: N/A   Occupational History  . customer service     [employer]   Social History Main Topics  . Smoking status: Never Smoker   . Smokeless tobacco: Not on file  . Alcohol Use: No  . Drug Use: No  . Sexual Activity: Not Currently   Other Topics Concern  . Not on file   Social History Narrative  . No narrative on file    No Known Allergies   Constitutional: Positive headache, fatigue. Denies fever or abrupt weight changes.  HEENT:  Positive sore throat. Denies eye redness, eye pain, pressure behind the eyes, facial pain, nasal congestion, ear pain, ringing in the ears, wax buildup, runny nose or bloody nose. Respiratory: Positive cough. Denies difficulty breathing or shortness of breath.  Cardiovascular: Denies chest pain, chest tightness, palpitations or swelling in the hands or feet.   No other specific complaints in a complete review of systems (except as listed in HPI above).  Objective:   BP 116/82  Pulse 69  Temp(Src) 98.1 F (36.7 C) (Oral)  Wt 199  lb 4 oz (90.379 kg)  BMI 33.16 kg/m2  SpO2 98%  LMP 11/15/2012 Wt Readings from Last 3 Encounters:  11/22/12 199 lb 4 oz (90.379 kg)  09/07/12 198 lb (89.812 kg)  05/04/12 193 lb (87.544 kg)     General: Appears her stated age, well developed, well nourished in NAD. HEENT: Head: normal shape and size; Eyes: sclera white, no icterus, conjunctiva pink, PERRLA and EOMs intact; Ears: Tm's gray and intact, normal light reflex; Nose: mucosa pink and moist, septum midline; Throat/Mouth: + PND. Teeth present, mucosa erythematous and moist, patchy white exudate noted on bilateral tonsillar pillars, no lesions or ulcerations noted.  Neck: Mild bilateral tonsillar lymphadenopathy. Neck supple, trachea midline. No massses, lumps or thyromegaly present.  Cardiovascular: Normal rate and rhythm. S1,S2 noted.  No murmur, rubs or gallops noted. No JVD or BLE edema. No carotid bruits noted. Pulmonary/Chest: Normal effort and positive vesicular breath sounds. No respiratory distress. No wheezes, rales or ronchi noted.      Assessment & Plan:   Bacterial pharyngitis:  Will repeat strep test and throat culture Get some rest and drink plenty of water Do salt water gargles for the sore throat Given symptoms will go ahead with eRx for Amoxicillin x 10 days Ibuprofen as needed for pain Work note provided  RTC as needed or if symptoms persist.

## 2013-01-20 ENCOUNTER — Emergency Department (HOSPITAL_COMMUNITY): Payer: BC Managed Care – PPO

## 2013-01-20 ENCOUNTER — Emergency Department (HOSPITAL_COMMUNITY)
Admission: EM | Admit: 2013-01-20 | Discharge: 2013-01-20 | Disposition: A | Discharge status: Home / Self Care | Payer: BC Managed Care – PPO | Attending: Emergency Medicine | Admitting: Emergency Medicine

## 2013-01-20 ENCOUNTER — Encounter (HOSPITAL_COMMUNITY): Payer: Self-pay | Admitting: Emergency Medicine

## 2013-01-20 DIAGNOSIS — R209 Unspecified disturbances of skin sensation: Secondary | ICD-10-CM | POA: Insufficient documentation

## 2013-01-20 DIAGNOSIS — R05 Cough: Secondary | ICD-10-CM | POA: Insufficient documentation

## 2013-01-20 DIAGNOSIS — R51 Headache: Secondary | ICD-10-CM | POA: Insufficient documentation

## 2013-01-20 DIAGNOSIS — R0789 Other chest pain: Secondary | ICD-10-CM

## 2013-01-20 DIAGNOSIS — R002 Palpitations: Secondary | ICD-10-CM | POA: Insufficient documentation

## 2013-01-20 DIAGNOSIS — R059 Cough, unspecified: Secondary | ICD-10-CM | POA: Insufficient documentation

## 2013-01-20 DIAGNOSIS — Z79899 Other long term (current) drug therapy: Secondary | ICD-10-CM | POA: Insufficient documentation

## 2013-01-20 DIAGNOSIS — M79609 Pain in unspecified limb: Secondary | ICD-10-CM | POA: Insufficient documentation

## 2013-01-20 DIAGNOSIS — M542 Cervicalgia: Secondary | ICD-10-CM | POA: Insufficient documentation

## 2013-01-20 LAB — BASIC METABOLIC PANEL WITH GFR
BUN: 7 mg/dL (ref 6–23)
CO2: 24 meq/L (ref 19–32)
Calcium: 8.9 mg/dL (ref 8.4–10.5)
Chloride: 104 meq/L (ref 96–112)
Creatinine, Ser: 0.69 mg/dL (ref 0.50–1.10)
GFR calc Af Amer: >90 mL/min
GFR calc non Af Amer: >90 mL/min
Glucose, Bld: 100 mg/dL — ABNORMAL HIGH (ref 70–99)
Potassium: 3.6 meq/L (ref 3.5–5.1)
Sodium: 138 meq/L (ref 135–145)

## 2013-01-20 LAB — POCT I-STAT TROPONIN I
Troponin i, poc: 0.02 ng/mL (ref 0.00–0.08)
Troponin i, poc: 0.01 ng/mL (ref 0.00–0.08)

## 2013-01-20 LAB — URINALYSIS, ROUTINE W REFLEX MICROSCOPIC
Bilirubin Urine: NEGATIVE
Glucose, UA: NEGATIVE mg/dL
Hgb urine dipstick: NEGATIVE
Ketones, ur: NEGATIVE mg/dL
Leukocytes, UA: NEGATIVE
Nitrite: NEGATIVE
Protein, ur: NEGATIVE mg/dL
Specific Gravity, Urine: 1.016 (ref 1.005–1.030)
Urobilinogen, UA: 1 mg/dL (ref 0.0–1.0)
pH: 7.5 (ref 5.0–8.0)

## 2013-01-20 LAB — CBC
HCT: 33.1 % — ABNORMAL LOW (ref 36.0–46.0)
Hemoglobin: 10.9 g/dL — ABNORMAL LOW (ref 12.0–15.0)
MCH: 27.5 pg (ref 26.0–34.0)
MCHC: 32.9 g/dL (ref 30.0–36.0)
MCV: 83.6 fL (ref 78.0–100.0)
Platelets: 272 K/uL (ref 150–400)
RBC: 3.96 MIL/uL (ref 3.87–5.11)
RDW: 14.6 % (ref 11.5–15.5)
WBC: 6.1 K/uL (ref 4.0–10.5)

## 2013-01-20 LAB — PRO B NATRIURETIC PEPTIDE: Pro B Natriuretic peptide (BNP): 15.3 pg/mL (ref 0–125)

## 2013-01-20 LAB — D-DIMER, QUANTITATIVE: D-Dimer, Quant: 0.59 ug{FEU}/mL — ABNORMAL HIGH (ref 0.00–0.48)

## 2013-01-20 MED ORDER — METOCLOPRAMIDE HCL 5 MG/ML IJ SOLN
10.0000 mg | Freq: Once | INTRAMUSCULAR | Status: AC
  Administered 2013-01-20: 10 mg via INTRAVENOUS
  Filled 2013-01-20: qty 2

## 2013-01-20 MED ORDER — DIPHENHYDRAMINE HCL 50 MG/ML IJ SOLN
25.0000 mg | Freq: Once | INTRAMUSCULAR | Status: AC
  Administered 2013-01-20: 25 mg via INTRAVENOUS
  Filled 2013-01-20: qty 1

## 2013-01-20 MED ORDER — SODIUM CHLORIDE 0.9 % IV BOLUS (SEPSIS)
1000.0000 mL | Freq: Once | INTRAVENOUS | Status: AC
  Administered 2013-01-20: 1000 mL via INTRAVENOUS

## 2013-01-20 MED ORDER — KETOROLAC TROMETHAMINE 30 MG/ML IJ SOLN
30.0000 mg | Freq: Once | INTRAMUSCULAR | Status: AC
  Administered 2013-01-20: 30 mg via INTRAVENOUS
  Filled 2013-01-20: qty 1

## 2013-01-20 NOTE — ED Provider Notes (Signed)
CSN: [id number]     Arrival date & time 01/20/13  1642 History   First MD Initiated Contact with Patient 01/20/13 1652     Chief Complaint  Patient presents with  . Chest Pain  . Palpitations   (Consider location/radiation/quality/duration/timing/severity/associated sxs/prior Treatment) The history is provided by the patient. No language interpreter was used.  Jillian Contreras is a 27 y/o F with no significant PMHx presenting to the ED with chest pain and palpitations that started at approximately 2:00PM this afternoon. Patient reported that she was sitting at her desk, relaxing, reported that she noticed that her heart was "fluttering" and stated that the fluttering sensation was intermittent. Patient reported that at the same time of fluttering she started to experience chest pain described as a dull, aching sensation localized to the left side of her chest with radiation down her left arm. Reported that she was experiencing left arm throbbing sensation to the tricep region with tingling sensations to the left hand. Patient reported that she has been experiencing mild left sided jaw discomfort described as an aching sensation. Patient reported that she called EMS and was administered 325 mg of ASA and SL nitroglycerin. Patient reported that she experienced similar symptoms approximately one month ago - stated that these symptoms were very similar. Patient reported that the symptoms were intermittent and lasted for approximately 2 weeks. Patient reported that she did not follow-up with her PCP regarding this complaint - stated that it went away by itself and stated that she did not follow-up because it went away on its own. Denied fever, chills, sweating, shortness of breath, difficulty breathing, head injuries, dizziness, LOC, fainting, weakness, loss of sensation.  PCP Dr. Orbie Pyo   History reviewed. No pertinent past medical history. Past Surgical History  Procedure Laterality Date  . Cesarean  section     Family History  Problem Relation Age of Onset  . Cancer Paternal Aunt 59    breast   History  Substance Use Topics  . Smoking status: Never Smoker   . Smokeless tobacco: Not on file  . Alcohol Use: No   OB History   Grav Para Term Preterm Abortions TAB SAB Ect Mult Living                 Review of Systems  Constitutional: Negative for fever and chills.  Respiratory: Positive for cough. Negative for shortness of breath.   Cardiovascular: Positive for chest pain and palpitations.  Musculoskeletal: Positive for arthralgias (left arm) and neck pain.  Neurological: Positive for headaches. Negative for dizziness and weakness.  All other systems reviewed and are negative.    Allergies  Review of patient's allergies indicates no known allergies.  Home Medications   Current Outpatient Rx  Name  Route  Sig  Dispense  Refill  . aspirin 81 MG tablet   Oral   Take 324 mg by mouth once.         . Chlorphen-Phenyleph-APAP (TYLENOL ALLERGY MULTI-SYMPTOM) 2-5-325 MG TABS   Oral   Take 1 tablet by mouth daily.          BP 111/63  Pulse 53  Temp(Src) 98.3 F (36.8 C) (Oral)  Resp 18  SpO2 99%  LMP 01/06/2013 Physical Exam  Nursing note and vitals reviewed. Constitutional: She is oriented to person, place, and time. She appears well-developed and well-nourished. No distress.  HENT:  Head: Normocephalic and atraumatic.  Mouth/Throat: Oropharynx is clear and moist. No oropharyngeal exudate.  Eyes: Conjunctivae  and EOM are normal. Pupils are equal, round, and reactive to light. Right eye exhibits no discharge. Left eye exhibits no discharge.  Neck: Normal range of motion. Neck supple. No tracheal deviation present.  Negative neck stiffness Negative nuchal rigidity Negative cervical LAD Negative meningeal signs  Cardiovascular: Normal rate, regular rhythm and normal heart sounds.  Exam reveals no friction rub.   No murmur heard. Pulses:      Radial pulses are  2+ on the right side, and 2+ on the left side.       Dorsalis pedis pulses are 2+ on the right side, and 2+ on the left side.  Pulmonary/Chest: Effort normal and breath sounds normal. No respiratory distress. She has no wheezes. She has no rales.  Musculoskeletal: Normal range of motion.  Full ROM to upper and lower extremities bilaterally without discomfort noted  Lymphadenopathy:    She has no cervical adenopathy.  Neurological: She is alert and oriented to person, place, and time. No cranial nerve deficit. She exhibits normal muscle tone. Coordination normal.  Strength 5+/5+ to upper and lower extremities bilaterally with resistance applied, equal distribution noted Follows commands appropriately Answers questions appropriately  Skin: Skin is warm and dry. No rash noted. She is not diaphoretic. No erythema.  Psychiatric: She has a normal mood and affect. Her behavior is normal. Thought content normal.    ED Course  Procedures (including critical care time)  5:16 PM Discussed case with Dr. Darlyn Chamber who agreed with plan for cardiac work-up. Recommended that patient be followed by cardiology upon discharge.   10:22 PM Nurse reported that patient was feeling better and that headache improved.   10:36 PM This provider spoke with patient and mother in great detail regarding labs, imaging results. Recommended that patient get a Holter monitor since is the second time the patient is experiencing chest discomfort in a similar manner. Discussed with patient to rest and stay hydrated. Discussed with patient what symptoms to watch out for and discussed with patient to closely monitor and keep track of her chest discomfort. Patient denied chest pain, shortness of breath, difficulty breathing. Patient headache is under control. Patient able to tolerate food by mouth and fluid by mouth.   Date: 01/20/2013  Rate: 57   Rhythm: normal sinus rhythm  QRS Axis: normal  Intervals: normal  ST/T Wave  abnormalities: normal  Conduction Disutrbances:none  Narrative Interpretation:   Old EKG Reviewed: none available EKG analyzed and reviewed by this provider and attending physician.    Labs Review Labs Reviewed  CBC - Abnormal; Notable for the following:    Hemoglobin 10.9 (*)    HCT 33.1 (*)    All other components within normal limits  BASIC METABOLIC PANEL - Abnormal; Notable for the following:    Glucose, Bld 100 (*)    All other components within normal limits  D-DIMER, QUANTITATIVE - Abnormal; Notable for the following:    D-Dimer, Quant 0.59 (*)    All other components within normal limits  URINALYSIS, ROUTINE W REFLEX MICROSCOPIC  PRO B NATRIURETIC PEPTIDE  POCT I-STAT TROPONIN I  POCT PREGNANCY, URINE  POCT I-STAT TROPONIN I   Imaging Review Ct Angio Chest Pe W/cm &/or Wo Cm  01/20/2013   CLINICAL DATA:  Elevated D-dimer. Shortness of breath. Clinical suspicion for pulmonary embolism.  EXAM: CT ANGIOGRAPHY CHEST WITH CONTRAST  TECHNIQUE: Multidetector CT imaging of the chest was performed using the standard protocol during bolus administration of intravenous contrast. Multiplanar CT image  reconstructions including MIPs were obtained to evaluate the vascular anatomy.  CONTRAST:  100 cc Omnipaque 350  COMPARISON:  None.  FINDINGS: Satisfactory opacification of pulmonary arteries noted, and there is no evidence of pulmonary emboli. No evidence of thoracic aortic dissection or aneurysm. No evidence of mediastinal hematoma or mass. No lymphadenopathy identified within the thorax.  Both lungs are clear. No evidence of infiltrate or mass. No evidence of pleural or pericardial effusion.  Review of the MIP images confirms the above findings.  IMPRESSION: Negative. No evidence of pulmonary embolism or other active disease.   Electronically Signed   By: Myles Rosenthal M.D.   On: 01/20/2013 20:07    EKG Interpretation   None       MDM   1. Atypical chest pain     Patient  presenting to the ED with heart fluttering sensations and chest pain that started at approximately 2:00PM. Patient reported that the chest pain is localized to the left side of the chest described as a dull aching sensation with radiation down her left arm with mild tingling sensations to the left hand. Patient reported that she was given ASA 325 mg and Nitroglycerin SL administered.  Alert and oriented. Heart rate and rhythm normal. Pulses palpable and strong, radial and DP. Strength 5+/5+ to upper and lower extremities bilaterally with equal distribution noted. Full ROM to upper and lower extremities bilaterally. Lungs clear to auscultation bilaterally to upper and lower lobes.  EKG negative ischemic findings. Negative elevation in troponins. Elevated d-dimer of 0.59. CBC negative findings - patient is anemic. BMP negative findings. Urine pregnancy negative. ProBNP negative elevation-15.3. Urinalysis negative for infection - negative nitrites, leukocytes. Chest xray discontinued, CT angio ordered. Second set of troponin negative elevation. CT angio chest negative for PE. Patient stable, afebrile. Headache and chest discomfort relieved while in ED setting. Patient denied shortness of breath, difficulty breathing, chest pain upon discharge. Negative findings for PE. Suspicion to be atypical presentation of chest pain. Discharge patient. Recommended and referred patient to cardiology for Holter monitor to be performed. Discussed with patient to closely monitor her chest pain, to keep track of her chest discomfort - as to when it started, how long, description, etc. Recommended patient to take aspirin 81 mg daily. Referred patient to her PCP. Discussed with patient to rest and stay hydrated and to avoid any strenuous activity. Discussed with patient to closely monitor symptoms and if symptoms worsen or change report back to emergency department - strict return instructions given. Patient agreed to plan of care,  understood, all questions answered.      Raymon Mutton, PA-C 01/21/13 2213

## 2013-01-20 NOTE — ED Notes (Signed)
Per EMS - pt c/o CP and palpitations, reports she had it at the beginning of the month and then it went away 2 weeks later then became worried because it came back today. 12 Lead NSR HR 60-70s. BP 118/75. Non diaphoretic, denies SOB. Has ring worm on right arm. Started 20 G in left AC.

## 2013-01-20 NOTE — ED Notes (Signed)
Notified CT urine preg is negative.

## 2013-01-20 NOTE — ED Notes (Signed)
Patient transported to CT 

## 2013-01-20 NOTE — ED Notes (Signed)
Pt returned from xray, ambulated to restroom to provide urine sample.

## 2013-01-20 NOTE — ED Notes (Signed)
Pt sts she took 324 mg of ASA PTA.

## 2013-01-20 NOTE — ED Notes (Signed)
Pt in radiology 

## 2013-01-22 NOTE — ED Provider Notes (Signed)
Medical screening examination/treatment/procedure(s) were performed by non-physician practitioner and as supervising physician I was immediately available for consultation/collaboration.  EKG Interpretation     Ventricular Rate:  57 PR Interval:  165 QRS Duration: 88 QT Interval:  405 QTC Calculation: 394 R Axis:   54 Text Interpretation:  Age not entered, assumed to be  27 years old for purpose of ECG interpretation Sinus rhythm ED PHYSICIAN INTERPRETATION AVAILABLE IN [facility]              Shelda Jakes, MD 01/22/13 [phone]

## 2013-01-23 ENCOUNTER — Ambulatory Visit: Payer: BC Managed Care – PPO | Admitting: Family Medicine

## 2013-01-24 ENCOUNTER — Ambulatory Visit (INDEPENDENT_AMBULATORY_CARE_PROVIDER_SITE_OTHER): Payer: BC Managed Care – PPO | Admitting: Cardiovascular Disease

## 2013-01-24 ENCOUNTER — Encounter: Payer: Self-pay | Admitting: *Deleted

## 2013-01-24 VITALS — BP 116/66 | HR 74 | Ht 65.0 in | Wt 200.1 lb

## 2013-01-24 DIAGNOSIS — R002 Palpitations: Secondary | ICD-10-CM | POA: Insufficient documentation

## 2013-01-24 DIAGNOSIS — R0789 Other chest pain: Secondary | ICD-10-CM

## 2013-01-24 DIAGNOSIS — E876 Hypokalemia: Secondary | ICD-10-CM

## 2013-01-24 NOTE — Progress Notes (Signed)
     Jillian Contreras Date of Birth  14-Oct-1985       [facility] Office [address]  [address] [place], [place]  [address]   [place], [place]  [address] [phone]     [phone]   Fax  [phone]    Fax [phone]  Problem List: 1. Palpitatioins 2, chest pain  History of Present Illness:  Jillian Contreras is a 27 yo who presents with several weeks of palpitations and chest pain.  She went to the ER and a CT was negative for PE.  She drinks coffee daily.  No exacerbating activities.  The episodes occurred while she was sitting at her desk.  (works at [employer])   She does not exercise regularly.  No associated with periods.    The palpitations are sporatic.  Not rapid or regular.    No current outpatient prescriptions on file prior to visit.   No current facility-administered medications on file prior to visit.    No Known Allergies  Past Medical History  Diagnosis Date  . Palpitations     Past Surgical History  Procedure Laterality Date  . Cesarean section      History  Smoking status  . Never Smoker   Smokeless tobacco  . Not on file    History  Alcohol Use No    Family History  Problem Relation Age of Onset  . Cancer Paternal Aunt 41    breast    Reviw of Systems:  Reviewed in the HPI.  All other systems are negative.  Physical Exam: Blood pressure 116/66, pulse 74, height 5\' 5"  (1.651 m), weight 200 lb 1.9 oz (90.774 kg), last menstrual period 01/06/2013. General: Well developed, well nourished, in no acute distress.  Head: Normocephalic, atraumatic, sclera non-icteric, mucus membranes are moist,   Neck: Supple. Carotids are 2 + without bruits. No JVD   Lungs: Clear   Heart: RR, normal S1, S2 soft systolic murmur  Abdomen: Soft, non-tender, non-distended with normal bowel sounds.  Msk:  Strength and tone are normal   Extremities: No clubbing or cyanosis. No edema.   Distal pedal pulses are 2+ and equal    Neuro: CN II - XII intact.  Alert and oriented X 3.   Psych:  Normal  ECG: Nov. 4, 2014:  NSR at 74, normal ECG  Assessment / Plan:

## 2013-01-24 NOTE — Assessment & Plan Note (Signed)
Jillian Contreras is  having frequent palpitations that sound like premature ventricular contractions. Her potassium level was found to be 3.6 recently. She does not eat a lot of potassium containing foods.  I've recommended that she make sure to get to sleep each day. I've recommended that she start a regular exercise program. I've recommended that she increase her potassium intake including drinking V8 juice is, eating bananas, potatoes, tomatoes, orange juice,  I do not think that her palpitations are serious. I offered to place a 30 day monitor on her but she would like to wait and see if the change in her diet and her exercise regimen helps with her palpitations.

## 2013-01-24 NOTE — Assessment & Plan Note (Signed)
Jillian Contreras presents with very atypical chest pain. These are always associated with palpitations. Her pretest probability D. of having coronary artery disease is extremely low and I do not think that a stress test as indicated. We will see her again in 3 months. If she continues to have episodes of chest pain and we will address this issue

## 2013-01-24 NOTE — Patient Instructions (Addendum)
Your physician recommends that you return for lab work in: TODAY TSH  Your physician recommends that you schedule a follow-up appointment in: 2-3 MONTHS   INCREASE POTASSIUM RICH FOODS IN YOUR DIET, PLEASE FOLLOW THE INSTRUCTION SHEET GIVEN   [facility] Low Glycemic Diet (Source: [facility], 2006) Low Glycemic Foods (20-49) (Decrease risk of developing heart disease) Breakfast Cereals: All-Bran All-Bran Fruit 'n Oats Fiber One Oatmeal (not instant) Oat bran Fruits and fruit juices: (Limit to 1-2 servings per day) Apples Apricots (fresh & dried) Blackberries Blueberries Cherries Cranberries Peaches Pears Plums Prunes Grapefruit Raspberries Strawberries Tangerine Apple juice Grapefruit juice Tomato juice Beans and legumes (fresh-cooked): Black-eyed peas Butter beans Chick peas Lentils  Green beans Lima beans Kidney beans Navy beans Pinto beans Snow peas Non-starchy vegetables: Asparagus, avocado, broccoli, cabbage, cauliflower, celery, cucumber, greens, lettuce, mushrooms, peppers, tomatoes, okra, onions, spinach, summer squash Grains: Barley Bulgur Rye Wild rice Nuts and oils : Almonds Peanuts Sunflower seeds Hazelnuts Pecans Walnuts Oils that are liquid at room temperature Dairy, fish, meat, soy, and eggs: Milk, skim Lowfat cheese Yogurt, lowfat, fruit sugar sweetened Lean red meat Fish  Skinless chicken & [place] Shellfish Egg whites (up to 3 daily) Soy products  Egg yolks (up to 7 or _____ per week) Moderate Glycemic Foods (50-69) Breakfast Cereals: Bran Buds Bran Chex Just Right Mini-Wheats  Special K Swiss muesli Fruits: Banana (under-ripe) Dates Figs Grapes Kiwi Mango Oranges Raisins Fruit Juices: Cranberry juice Orange juice Beans and legumes: Boston-type baked beans Canned pinto, kidney, or navy beans Green peas Vegetables: Beets Carrots  Sweet potato Yam Corn on the cob Breads: Pita (pocket) bread Oat bran  bread Pumpernickel bread Rye bread Wheat bread, high fiber  Grains: Cornmeal Rice, brown Rice, white Couscous Pasta: Macaroni Pizza, cheese Ravioli, meat filled Spaghetti, white  Nuts: Cashews Macadamia Snacks: Chocolate Ice cream, lowfat Muffin Popcorn High Glycemic Foods (70-100)  Breakfast Cereals: Cheerios Corn Chex Corn Flakes Cream of Wheat Grape Nuts Grape Nut Flakes Grits Nutri-Grain Puffed Rice Puffed Wheat Rice Chex Rice Krispies Shredded Wheat Team Total Fruits: Pineapple Watermelon Banana (over-ripe) Beverages: Sodas, sweet tea, pineapple juice Vegetables: Potato, baked, boiled, fried, mashed [place] fries Canned or frozen corn Parsnips Winter squash Breads: Most breads (white and whole grain) Bagels Bread sticks Bread stuffing Kaiser roll Dinner rolls Grains: Rice, instant Tapioca, with milk Candy and most cookies Snacks: Donuts Corn chips Jelly beans Pretzels Pastries

## 2013-01-26 ENCOUNTER — Other Ambulatory Visit: Payer: Self-pay

## 2013-01-26 ENCOUNTER — Ambulatory Visit (INDEPENDENT_AMBULATORY_CARE_PROVIDER_SITE_OTHER): Payer: BC Managed Care – PPO | Admitting: Family Medicine

## 2013-01-26 ENCOUNTER — Encounter: Payer: Self-pay | Admitting: Family Medicine

## 2013-01-26 VITALS — BP 118/76 | HR 72 | Temp 98.1°F | Wt 196.0 lb

## 2013-01-26 DIAGNOSIS — R002 Palpitations: Secondary | ICD-10-CM

## 2013-01-26 NOTE — Progress Notes (Signed)
  Subjective:    Patient ID: Jillian Contreras, female    DOB: 1985/09/11, 27 y.o.   MRN: [id number]  HPI CC: f/u ER visit   Jillian Contreras had an appointment with me on Monday but missed this.  She was evaluated at ER on 01/20/2013 with second episode of "heart flutter" palpitations associated with chest discomfort radiating down arm with L arm numbness - workup unrevealing including CTA negative for PE, troponins neg x2, and Hgb low at 10.9, referred to cardiology.  She saw Dr. Melburn Popper this week who thought PVCs were causing palpitations.  K was borderline at 3.6, discussed potassium rich diet with cardiologist.  Has f/u scheduled with cardiology.  Discussed holter monitor but pt decided to defer for now - work on healthy diet/lifestyle changes.  Advised regular walking to slowly titrate up to 40min/day several times a week. Recent check - thyroid normal.  All records reviewed.  This morning felt some flutters.  Flutters described as skipped beats.  They resolved quickly. Has decreased caffeine intake. New onset palpitations this month.  Prior had never had an issue.  Implementing health changes.  Several URI illnesses over last several months - may have been slightly dehydrated prior to these episodes.  Earlier this year had back pain - evaluated at [facility] - sent to ER with concern for blood clot in lungs.  Xray - consistent with bronchitis and pleurisy.  Wt Readings from Last 3 Encounters:  01/26/13 196 lb (88.905 kg)  01/24/13 200 lb 1.9 oz (90.774 kg)  11/22/12 199 lb 4 oz (90.379 kg)   Body mass index is 32.62 kg/(m^2).   Past Medical History  Diagnosis Date  . Palpitations      Review of Systems Per HPI    Objective:   Physical Exam  Nursing note and vitals reviewed. Constitutional: She appears well-developed and well-nourished. No distress.  HENT:  Mouth/Throat: Oropharynx is clear and moist. No oropharyngeal exudate.  Eyes: Conjunctivae and EOM are normal. Pupils are equal,  round, and reactive to light.  Cardiovascular: Normal rate, regular rhythm, normal heart sounds and intact distal pulses.   No murmur heard. Pulmonary/Chest: Effort normal and breath sounds normal. No respiratory distress. She has no wheezes. She has no rales.  Musculoskeletal: She exhibits no edema.  Skin: Skin is warm and dry. No rash noted.  Psychiatric: She has a normal mood and affect.       Assessment & Plan:

## 2013-01-26 NOTE — Progress Notes (Signed)
Pre-visit discussion using our clinic review tool. No additional management support is needed unless otherwise documented below in the visit note.  

## 2013-01-26 NOTE — Assessment & Plan Note (Signed)
Anticipate combination of increased caffeine recently (had been drinking more coke along with her regular coffee and some tea) along with possible mild dehydration led to palpations. Pt has implemented overall healthy diet and lifestyle changes and actually noticed some weight loss over the last week - congratulated and encouraged sustaining healthy changes. Keep appt with cards 03/2013.

## 2013-01-26 NOTE — Patient Instructions (Signed)
Congratulations on healthy diet changes! Keep it up. Keep appointment with Dr. Elease Hashimoto.

## 2013-03-30 ENCOUNTER — Ambulatory Visit: Payer: BC Managed Care – PPO | Admitting: Cardiovascular Disease

## 2013-05-04 ENCOUNTER — Encounter: Payer: Self-pay | Admitting: Family Medicine

## 2013-05-04 ENCOUNTER — Ambulatory Visit: Payer: BC Managed Care – PPO | Admitting: Cardiovascular Disease

## 2013-05-04 ENCOUNTER — Encounter: Payer: BC Managed Care – PPO | Admitting: Cardiovascular Disease

## 2013-05-04 ENCOUNTER — Ambulatory Visit (INDEPENDENT_AMBULATORY_CARE_PROVIDER_SITE_OTHER): Payer: BC Managed Care – PPO | Admitting: Family Medicine

## 2013-05-04 VITALS — BP 122/68 | HR 55 | Temp 97.9°F | Ht 65.0 in | Wt 193.8 lb

## 2013-05-04 DIAGNOSIS — R197 Diarrhea, unspecified: Secondary | ICD-10-CM

## 2013-05-04 NOTE — Progress Notes (Signed)
This encounter was created in error - please disregard.  This encounter was created in error - please disregard.

## 2013-05-04 NOTE — Progress Notes (Signed)
Pre-visit discussion using our clinic review tool. No additional management support is needed unless otherwise documented below in the visit note.  

## 2013-05-04 NOTE — Patient Instructions (Signed)
Great to see you. I think you likely have a variation of a stomach bug. Please drink plenty of fluids. Call me on Monday with an update.

## 2013-05-04 NOTE — Progress Notes (Signed)
(  S) Jillian Contreras is a 28 y.o. female with complaint of gastrointestinal symptoms of watery diarrhea, mucous, abdominal pain, lower abdominal cramps, nausea for 4 days. No blood in stool.  Patient Active Problem List   Diagnosis Date Noted  . Palpitations 01/24/2013  . Atypical chest pain 01/24/2013  . Routine general medical examination at a health care facility 08/20/2011  . Encounter for routine gynecological examination 08/20/2011   Past Medical History  Diagnosis Date  . Palpitations   . Palpitations   . Hypokalemia   . Chest pain    Past Surgical History  Procedure Laterality Date  . Cesarean section     History  Substance Use Topics  . Smoking status: Never Smoker   . Smokeless tobacco: Never Used  . Alcohol Use: No   Family History  Problem Relation Age of Onset  . Cancer Paternal Aunt [id number]    breast   No Known Allergies Current Outpatient Prescriptions on File Prior to Visit  Medication Sig Dispense Refill  . Multiple Vitamin (MULTIVITAMIN) tablet Take 1 tablet by mouth daily.       No current facility-administered medications on file prior to visit.   The PMH, PSH, Social History, Family History, Medications, and allergies [facility], and have been updated if relevant.  (O) Physical exam reveals the patient appears well. Hydration status: well hydrated. Abdomen: abdomen is soft without significant tenderness, masses, organomegaly or guarding..  (A) Viral Gastroenteritis  (P) I have recommended small amounts clear fluids frequently, soups, juices, water and advance diet as tolerated. Return office visit if symptoms persist or worsen; I have alerted the patient to call if high fever, dehydration, marked weakness, fainting, increased abdominal pain, blood in stool or vomit.

## 2013-05-05 ENCOUNTER — Encounter: Payer: Self-pay | Admitting: Cardiovascular Disease

## 2013-06-30 ENCOUNTER — Telehealth: Payer: Self-pay | Admitting: Family Medicine

## 2013-06-30 NOTE — Telephone Encounter (Signed)
Patient Information:  Caller Name: Avian  Phone: [phone]  Patient: Jillian Contreras, Jillian Contreras  Gender: Female  DOB: 1985/12/23  Age: [age] Years  PCP: Ruthe MannanAron, [facility](Family Practice)  Pregnant: No  Office Follow Up:  Does the office need to follow up with this patient?: No  Instructions For The Office: N/A  RN Note:  While going over the care advise she shared that she has been taking alot of NSAIDS for her sinus issues.  She will stop these immediately.  Symptoms  Reason For Call & Symptoms: Patient calling, she has had diarrhea since 4/7.  Has only had 1 stool today.  Had 6 stools on Thurday 4/9.  Has some nausea but no vomiting.  Reviewed Health History In EMR: Yes  Reviewed Medications In EMR: Yes  Reviewed Allergies In EMR: Yes  Reviewed Surgeries / Procedures: Yes  Date of Onset of Symptoms: 06/27/2013 OB / GYN:  LMP: 06/22/2013  Guideline(s) Used:  Abdominal Pain - Female  Abdominal Pain - Upper  Disposition Per Guideline:   Home Care  Reason For Disposition Reached:   Mild abdominal pain  Advice Given:  Reassurance:  Here is some care advice that should help.  Diet:  Slowly advance diet from clear liquids to a bland diet.  Avoid alcohol or caffeinated beverages.  Avoid greasy or fatty foods.  Antacid:  If having pain now, try taking an antacid (e.g., Mylanta, Maalox). Dose: 2 tablespoons (30 ml) of liquid by mouth.  Avoid NSAIDS and Aspirin  : Avoid any drug that can irritate the stomach lining and make the pain worse (especially aspirin and NSAIDs like ibuprofen).  Stop Smoking:  Smoking can aggravate heartburn and stomach problems.  Reducing Reflux Symptoms (GERD):  Eat smaller meals and avoid snacks for 2 hours before sleeping. Avoid the following foods, which tend to aggravate heartburn and stomach problems: fatty/greasy foods, spicy foods, caffeinated beverages, mints, and chocolate.  Reducing Reflux Symptoms (GERD):  Eat smaller meals and avoid snacks for 2  hours before sleeping. Avoid the following foods, which tend to aggravate heartburn and stomach problems: fatty/greasy foods, spicy foods, caffeinated beverages, mints, and chocolate.  Expected Course:  With harmless causes, the pain usually lessens or is resolved in 2 hours. With gastroenteritis, stomach cramps may precede each bout of vomiting or diarrhea. With serious causes (such as appendicitis), the pain becomes constant and severe.  Call Back If:  Abdominal pain is constant and present for more than 2 hours.  You become worse.  Patient Will Follow Care Advice:  YES

## 2013-07-03 ENCOUNTER — Ambulatory Visit: Payer: BC Managed Care – PPO | Admitting: Family Medicine

## 2013-09-04 ENCOUNTER — Other Ambulatory Visit (INDEPENDENT_AMBULATORY_CARE_PROVIDER_SITE_OTHER): Payer: BC Managed Care – PPO

## 2013-09-04 DIAGNOSIS — Z0000 Encounter for general adult medical examination without abnormal findings: Secondary | ICD-10-CM

## 2013-09-04 LAB — COMPREHENSIVE METABOLIC PANEL
ALBUMIN: 3.9 g/dL (ref 3.5–5.2)
ALT: 29 U/L (ref 0–35)
AST: 32 U/L (ref 0–37)
Alkaline Phosphatase: 60 U/L (ref 39–117)
BILIRUBIN TOTAL: 0.2 mg/dL (ref 0.2–1.2)
BUN: 10 mg/dL (ref 6–23)
CO2: 27 meq/L (ref 19–32)
Calcium: 8.9 mg/dL (ref 8.4–10.5)
Chloride: 106 mEq/L (ref 96–112)
Creatinine, Ser: 0.8 mg/dL (ref 0.4–1.2)
GFR: 115.41 mL/min (ref >60.00)
GLUCOSE: 113 mg/dL — AB (ref 70–99)
Potassium: 3.7 mEq/L (ref 3.5–5.1)
Sodium: 138 mEq/L (ref 135–145)
Total Protein: 7 g/dL (ref 6.0–8.3)

## 2013-09-04 LAB — CBC WITH DIFFERENTIAL/PLATELET
Basophils Absolute: 0 10*3/uL (ref 0.0–0.1)
Basophils Relative: 0.5 % (ref 0.0–3.0)
Eosinophils Absolute: 0.1 10*3/uL (ref 0.0–0.7)
Eosinophils Relative: 2.2 % (ref 0.0–5.0)
HCT: 33 % — ABNORMAL LOW (ref 36.0–46.0)
HEMOGLOBIN: 10.9 g/dL — AB (ref 12.0–15.0)
LYMPHS PCT: 39.7 % (ref 12.0–46.0)
Lymphs Abs: 2.1 10*3/uL (ref 0.7–4.0)
MCHC: 32.9 g/dL (ref 30.0–36.0)
MCV: 82.1 fl (ref 78.0–100.0)
MONOS PCT: 6.4 % (ref 3.0–12.0)
Monocytes Absolute: 0.3 10*3/uL (ref 0.1–1.0)
NEUTROS PCT: 51.2 % (ref 43.0–77.0)
Neutro Abs: 2.7 10*3/uL (ref 1.4–7.7)
Platelets: 269 10*3/uL (ref 150.0–400.0)
RBC: 4.03 Mil/uL (ref 3.87–5.11)
RDW: 14.4 % (ref 11.5–15.5)
WBC: 5.3 10*3/uL (ref 4.0–10.5)

## 2013-09-04 LAB — LIPID PANEL
CHOLESTEROL: 193 mg/dL (ref 0–200)
HDL: 41.4 mg/dL (ref >39.00)
LDL Cholesterol: 130 mg/dL — ABNORMAL HIGH (ref 0–99)
NonHDL: 151.6
Total CHOL/HDL Ratio: 5
Triglycerides: 108 mg/dL (ref 0.0–149.0)
VLDL: 21.6 mg/dL (ref 0.0–40.0)

## 2013-09-04 LAB — TSH: TSH: 2.68 u[IU]/mL (ref 0.35–4.50)

## 2013-09-11 ENCOUNTER — Ambulatory Visit (INDEPENDENT_AMBULATORY_CARE_PROVIDER_SITE_OTHER): Payer: BC Managed Care – PPO | Admitting: Family Medicine

## 2013-09-11 ENCOUNTER — Other Ambulatory Visit (HOSPITAL_COMMUNITY)
Admission: RE | Admit: 2013-09-11 | Discharge: 2013-09-11 | Disposition: A | Discharge status: Home / Self Care | Payer: BC Managed Care – PPO | Source: Ambulatory Visit | Attending: Family Medicine | Admitting: Family Medicine

## 2013-09-11 ENCOUNTER — Encounter: Payer: Self-pay | Admitting: Family Medicine

## 2013-09-11 VITALS — BP 124/68 | HR 66 | Temp 97.9°F | Ht 65.0 in | Wt 198.5 lb

## 2013-09-11 DIAGNOSIS — Z01419 Encounter for gynecological examination (general) (routine) without abnormal findings: Secondary | ICD-10-CM

## 2013-09-11 DIAGNOSIS — D649 Anemia, unspecified: Secondary | ICD-10-CM

## 2013-09-11 DIAGNOSIS — R7309 Other abnormal glucose: Secondary | ICD-10-CM

## 2013-09-11 DIAGNOSIS — N922 Excessive menstruation at puberty: Secondary | ICD-10-CM

## 2013-09-11 DIAGNOSIS — R739 Hyperglycemia, unspecified: Secondary | ICD-10-CM

## 2013-09-11 DIAGNOSIS — R21 Rash and other nonspecific skin eruption: Secondary | ICD-10-CM

## 2013-09-11 DIAGNOSIS — Z0000 Encounter for general adult medical examination without abnormal findings: Secondary | ICD-10-CM

## 2013-09-11 DIAGNOSIS — N920 Excessive and frequent menstruation with regular cycle: Secondary | ICD-10-CM | POA: Insufficient documentation

## 2013-09-11 LAB — HEMOGLOBIN A1C: HEMOGLOBIN A1C: 6.2 % (ref 4.6–6.5)

## 2013-09-11 MED ORDER — FERROUS SULFATE 325 (65 FE) MG PO TABS: 325.0000 mg | ORAL_TABLET | Freq: Every day | ORAL | Status: DC

## 2013-09-11 MED ORDER — NORETHIN ACE-ETH ESTRAD-FE 1.5-30 MG-MCG PO TABS: 1.0000 | ORAL_TABLET | Freq: Every day | ORAL | Status: DC

## 2013-09-11 NOTE — Progress Notes (Signed)
Pre visit review using our clinic review tool, if applicable. No additional management support is needed unless otherwise documented below in the visit note. 

## 2013-09-11 NOTE — Addendum Note (Signed)
Addended by: Desmond DikeKNIGHT, Tyra Gural H on: 09/11/2013 12:37 PM   Modules accepted: Orders

## 2013-09-11 NOTE — Assessment & Plan Note (Signed)
a1c today 

## 2013-09-11 NOTE — Assessment & Plan Note (Signed)
Start loestrin.

## 2013-09-11 NOTE — Assessment & Plan Note (Signed)
Reviewed preventive care protocols, scheduled due services, and updated immunizations Discussed nutrition, exercise, diet, and healthy lifestyle.  

## 2013-09-11 NOTE — Progress Notes (Signed)
Subjective:    Patient ID: Jillian Contreras, female    DOB: 06-Jan-1986, 28 y.o.   MRN: [id number]  HPI  28 yo pleasant female here for CPX. Last pap smear done by me on 09/07/2012- neg. Did have an abnormal pap smear a few years.  Has not been sexually active in over three years.  Hyperglycemia- Fasting FSBS 113.  Strong FH of DM. Denies any increased thirst or urination.  Lab Results  Component Value Date   CHOL 193 09/04/2013   HDL 41.40 09/04/2013   LDLCALC 130* 09/04/2013   LDLDIRECT 149.6 08/20/2011   TRIG 108.0 09/04/2013   CHOLHDL 5 09/04/2013   Anemia- `persistent.  Does have very heavy periods and she has been more fatigued lately.  Denies any DOE or LE edema. Lab Results  Component Value Date   WBC 5.3 09/04/2013   HGB 10.9* 09/04/2013   HCT 33.0* 09/04/2013   MCV 82.1 09/04/2013   PLT 269.0 09/04/2013   Lab Results  Component Value Date   CREATININE 0.8 09/04/2013     Doing well, trying to loose weight with diet and exercise but has gained weight.  She feels she is now motivated to take better care of herself.  Wt Readings from Last 3 Encounters:  09/11/13 198 lb 8 oz (90.039 kg)  05/04/13 193 lb 12 oz (87.884 kg)  01/26/13 196 lb (88.905 kg)   Patient Active Problem List   Diagnosis Date Noted  . Rash and nonspecific skin eruption 09/11/2013  . Palpitations 01/24/2013  . Atypical chest pain 01/24/2013  . Routine general medical examination at a health care facility 08/20/2011  . Encounter for routine gynecological examination 08/20/2011   Past Medical History  Diagnosis Date  . Palpitations   . Palpitations   . Hypokalemia   . Chest pain    Past Surgical History  Procedure Laterality Date  . Cesarean section     History  Substance Use Topics  . Smoking status: Never Smoker   . Smokeless tobacco: Never Used  . Alcohol Use: No   Family History  Problem Relation Age of Onset  . Cancer Paternal Aunt [id number]    breast   No Known Allergies Current  Outpatient Prescriptions on File Prior to Visit  Medication Sig Dispense Refill  . Multiple Vitamin (MULTIVITAMIN) tablet Take 1 tablet by mouth daily.       No current facility-administered medications on file prior to visit.   The PMH, PSH, Social History, Family History, Medications, and allergies [facility], and have been updated if relevant.    Review of Systems    See HPI Patient reports no  vision/ hearing changes,anorexia, weight change, fever ,adenopathy, persistant / recurrent hoarseness, swallowing issues, chest pain, edema,persistant / recurrent cough, hemoptysis, dyspnea(rest, exertional, paroxysmal nocturnal), gastrointestinal  bleeding (melena, rectal bleeding), abdominal pain, excessive heart burn, GU symptoms(dysuria, hematuria, pyuria, voiding/incontinence  Issues) syncope, focal weakness, severe memory loss, concerning skin lesions, depression, anxiety, abnormal bruising/bleeding, major joint swelling, breast masses or abnormal vaginal bleeding.    Objective:   Physical Exam BP 124/68  Pulse 66  Temp(Src) 97.9 F (36.6 C) (Oral)  Ht 5\' 5"  (1.651 m)  Wt 198 lb 8 oz (90.039 kg)  BMI 33.03 kg/m2  SpO2 97%  LMP 09/04/2013 Wt Readings from Last 3 Encounters:  09/11/13 198 lb 8 oz (90.039 kg)  05/04/13 193 lb 12 oz (87.884 kg)  01/26/13 196 lb (88.905 kg)    General:  Well-developed,well-nourished,in  no acute distress; alert,appropriate and cooperative throughout examination Head:  normocephalic and atraumatic.   Eyes:  vision grossly intact, pupils equal, pupils round, and pupils reactive to light.   Ears:  R ear normal and L ear normal.   Nose:  no external deformity.   Mouth:  good dentition.   Neck:  No deformities, masses, or tenderness noted. Breasts:  No mass, nodules, thickening, tenderness, bulging, retraction, inflamation, nipple discharge or skin changes noted.   Lungs:  Normal respiratory effort, chest expands symmetrically. Lungs are  clear to auscultation, no crackles or wheezes. Heart:  Normal rate and regular rhythm. S1 and S2 normal without gallop, murmur, click, rub or other extra sounds. Abdomen:  Bowel sounds positive,abdomen soft and non-tender without masses, organomegaly or hernias noted. Rectal:  no external abnormalities.   Genitalia:  Pelvic Exam:        External: normal female genitalia without lesions or masses        Vagina: normal without lesions or masses        Cervix: normal without lesions or masses        Adnexa: normal bimanual exam without masses or fullness        Uterus: normal by palpation        Pap smear: performed Msk:  No deformity or scoliosis noted of thoracic or lumbar spine.   Extremities:  No clubbing, cyanosis, edema, or deformity noted with normal full range of motion of all joints.   Neurologic:  alert & oriented X3 and gait normal.   Skin:  Intact without suspicious lesions or rashes Cervical Nodes:  No lymphadenopathy noted Axillary Nodes:  No palpable lymphadenopathy Psych:  Cognition and judgment appear intact. Alert and cooperative with normal attention span and concentration. No apparent delusions, illusions, hallucinations     Assessment & Plan:

## 2013-09-11 NOTE — Assessment & Plan Note (Signed)
Persistent. Likely due to menorrhagia. Start OCPs and ferrous sulfate daily. Follow up in 2 months.

## 2013-09-11 NOTE — Assessment & Plan Note (Signed)
Discussed USPSTF recommendations of cervical cancer screening.  She is aware that interval of 3 years is recommended but pt would prefer to have pap smear done today.  

## 2013-09-12 LAB — CYTOLOGY - PAP

## 2013-09-13 ENCOUNTER — Encounter: Payer: Self-pay | Admitting: *Deleted

## 2013-09-13 ENCOUNTER — Encounter: Payer: Self-pay | Admitting: Family Medicine

## 2013-09-13 NOTE — Telephone Encounter (Signed)
Pt responded to via mychart 

## 2013-12-27 ENCOUNTER — Encounter: Payer: Self-pay | Admitting: Family Medicine

## 2013-12-27 ENCOUNTER — Ambulatory Visit (INDEPENDENT_AMBULATORY_CARE_PROVIDER_SITE_OTHER): Payer: BC Managed Care – PPO | Admitting: Family Medicine

## 2013-12-27 VITALS — BP 122/68 | HR 86 | Temp 98.1°F | Wt 204.2 lb

## 2013-12-27 DIAGNOSIS — H00013 Hordeolum externum right eye, unspecified eyelid: Secondary | ICD-10-CM

## 2013-12-27 MED ORDER — ERYTHROMYCIN 5 MG/GM OP OINT: 1.0000 "application " | TOPICAL_OINTMENT | Freq: Four times a day (QID) | OPHTHALMIC | Status: DC

## 2013-12-27 NOTE — Progress Notes (Signed)
Pre visit review using our clinic review tool, if applicable. No additional management support is needed unless otherwise documented below in the visit note. 

## 2013-12-27 NOTE — Patient Instructions (Signed)
Sty A sty (hordeolum) is an infection of a gland in the eyelid located at the base of the eyelash. A sty may develop a white or yellow head of pus. It can be puffy (swollen). Usually, the sty will burst and pus will come out on its own. They do not leave lumps in the eyelid once they drain. A sty is often confused with another form of cyst of the eyelid called a chalazion. Chalazions occur within the eyelid and not on the edge where the bases of the eyelashes are. They often are red, sore and then form firm lumps in the eyelid. CAUSES   Germs (bacteria).  Lasting (chronic) eyelid inflammation. SYMPTOMS   Tenderness, redness and swelling along the edge of the eyelid at the base of the eyelashes.  Sometimes, there is a white or yellow head of pus. It may or may not drain. DIAGNOSIS  An ophthalmologist will be able to distinguish between a sty and a chalazion and treat the condition appropriately.  TREATMENT   Styes are typically treated with warm packs (compresses) until drainage occurs.  In rare cases, medicines that kill germs (antibiotics) may be prescribed. These antibiotics may be in the form of drops, cream or pills.  If a hard lump has formed, it is generally necessary to do a small incision and remove the hardened contents of the cyst in a minor surgical procedure done in the office. HOME CARE INSTRUCTIONS   Wash your hands often and dry them with a clean towel. Avoid touching your eyelid. This may spread the infection to other parts of the eye.  Apply heat to your eyelid for 10 to 20 minutes, several times a day, to ease pain and help to heal it faster.  Do not squeeze the sty. Allow it to drain on its own. Wash your eyelid carefully 3 to 4 times per day to remove any pus. SEEK IMMEDIATE MEDICAL CARE IF:   Your eye becomes painful or puffy (swollen).  Your vision changes.  Your sty does not drain by itself within 3 days.  Your sty comes back within a short period of time,  even with treatment.  You have redness (inflammation) around the eye.  You have a fever. Document Released: 12/17/2004 Document Revised: 06/01/2011 Document Reviewed: 06/23/2013 ExitCare Patient Information 2015 [employer]. This information is not intended to replace advice given to you by your health care provider. Make sure you discuss any questions you have with your health care provider.  

## 2013-12-27 NOTE — Progress Notes (Signed)
SUBJECTIVE: Jillian Contreras is a 28 y.o. female who complains of a left upper eyelid stye for 3 day(s). No fever, chills, no URI symptoms, no history of foreign body in the eye. Vision has been normal.  Current Outpatient Prescriptions on File Prior to Visit  Medication Sig Dispense Refill  . ferrous sulfate 325 (65 FE) MG tablet Take 1 tablet (325 mg total) by mouth daily with breakfast.  30 tablet  3  . Multiple Vitamin (MULTIVITAMIN) tablet Take 1 tablet by mouth daily.      . norethindrone-ethinyl estradiol-iron (MICROGESTIN FE,GILDESS FE,LOESTRIN FE) 1.5-30 MG-MCG tablet Take 1 tablet by mouth daily.  1 Package  11   No current facility-administered medications on file prior to visit.    No Known Allergies  Past Medical History  Diagnosis Date  . Palpitations   . Palpitations   . Hypokalemia   . Chest pain     Past Surgical History  Procedure Laterality Date  . Cesarean section      Family History  Problem Relation Age of Onset  . Cancer Paternal Aunt [id number]    breast    History   Social History  . Marital Status: Single    Spouse Name: N/A    Number of Children: N/A  . Years of Education: N/A   Occupational History  . customer service     [employer]   Social History Main Topics  . Smoking status: Never Smoker   . Smokeless tobacco: Never Used  . Alcohol Use: No  . Drug Use: No  . Sexual Activity: Not Currently   Other Topics Concern  . Not on file   Social History Narrative  . No narrative on file   The PMH, PSH, Social History, Family History, Medications, and allergies [facility], and have been updated if relevant.  OBJECTIVE: She appears well, vitals are normal. Hordeolum noted left upper eyelid. PERLA, fundi normal. Visual acuity as noted. Ears, throat normal, no periorbital cellulitis, no neck lymphadenopathy. BP 122/68  Pulse 86  Temp(Src) 98.1 F (36.7 C) (Oral)  Wt 204 lb 4 oz (92.647 kg)  SpO2 97%  LMP  12/19/2013   ASSESSMENT: stye/hordeolum  PLAN: Frequent warm soaks, use antibiotic ophthalmic ointment as prescribed, and follow up if symptoms persist or worsen. It may take several days for this to resolve. Rarely, these persist or enlarge and in that event, she will need to see an Ophthalmologist. Patient agrees with the medical treatment plan.

## 2014-07-03 ENCOUNTER — Ambulatory Visit (INDEPENDENT_AMBULATORY_CARE_PROVIDER_SITE_OTHER): Payer: BLUE CROSS/BLUE SHIELD | Admitting: Primary Care

## 2014-07-03 ENCOUNTER — Encounter: Payer: Self-pay | Admitting: Primary Care

## 2014-07-03 VITALS — BP 118/70 | HR 65 | Temp 98.4°F | Ht 65.0 in | Wt 205.1 lb

## 2014-07-03 DIAGNOSIS — R12 Heartburn: Secondary | ICD-10-CM | POA: Diagnosis not present

## 2014-07-03 DIAGNOSIS — L0293 Carbuncle, unspecified: Secondary | ICD-10-CM

## 2014-07-03 MED ORDER — RANITIDINE HCL 150 MG PO TABS: 150.0000 mg | ORAL_TABLET | Freq: Every day | ORAL | Status: DC

## 2014-07-03 NOTE — Progress Notes (Signed)
Pre visit review using our clinic review tool, if applicable. No additional management support is needed unless otherwise documented below in the visit note. 

## 2014-07-03 NOTE — Assessment & Plan Note (Signed)
History of bilaterally to axilla. Small tender, non-infected boil present to right axilla. No antibiotics needed today. Encouraged patient to avoid use of Degree deodorant and to switch to a mild unscented deodorant. Referral to derm due to re-occurrence.

## 2014-07-03 NOTE — Progress Notes (Signed)
Subjective:    Patient ID: Jillian Contreras, female    DOB: 09-13-1985, 29 y.o.   MRN: [id number]  HPI  Jillian Contreras is a 29 year old female who presents today with multiple complaints.  1) Recurrent boils: She has a history of axillary boils intermittently for the past 4 years. They will become enlarged and painful with occasional puss. Lately she's began experiencing more frequent reoccurrences. This incidence started April 4th/5th when she noticed them under her left axilla. She reports 3-4 small bumps that enlarged and were tender. She also had a headache and fever. The swelling decreased to her left axilla a few days later. This past Sunday she noticed 3 bumps with swelling and pain to the right axilla. She denies headaches and fever. She did switch deodorants from [employer] to Degree 2 months ago. She bought a travel sized Degree to take with her on a trip at the end of March that was the same brand but a different scent. She has since stopped using the newer deodorant.   2) Burning reflux: Over the past several months she's noticed she's been unable to eat a full dinner at night because once she goes to bed she'll experience coughing fits with a fullness to her epigastric region. Last night she noticed a burning sensation to the epigastric region that traveled up to her throat. Today she reports generalized discomfort and a fullness sensation. Denies abdominal pain, bloody stools, constipation, and reports the burning sensation has improved. She does have some nausea today, denies vomiting. She's able to eat and drink as usual but has started to eat a mild dinner at night due to symptoms.  She has had 2 loose stools in the past 2 days.   3) Low back pain: Right sided that started today. Denies dysuria, hematuria. fevers. Denies recent trauma. She's not taken any medications for back pain.   Review of Systems  Constitutional: Negative for fever and chills.  Respiratory: Negative for shortness of  breath.   Cardiovascular: Negative for chest pain.  Gastrointestinal: Positive for nausea and diarrhea. Negative for vomiting, constipation and blood in stool.       Epigastric burning  Genitourinary: Negative for dysuria and urgency.  Musculoskeletal: Positive for back pain.  Neurological: Positive for light-headedness.       Occasional headaches once weekly to left and right side. Does not take anything.       Past Medical History  Diagnosis Date  . Palpitations   . Palpitations   . Hypokalemia   . Chest pain     History   Social History  . Marital Status: Single    Spouse Name: N/A  . Number of Children: N/A  . Years of Education: N/A   Occupational History  . customer service     [employer]   Social History Main Topics  . Smoking status: Never Smoker   . Smokeless tobacco: Never Used  . Alcohol Use: No  . Drug Use: No  . Sexual Activity: Not Currently   Other Topics Concern  . Not on file   Social History Narrative    Past Surgical History  Procedure Laterality Date  . Cesarean section      Family History  Problem Relation Age of Onset  . Cancer Paternal Aunt [id number]    breast    No Known Allergies  Current Outpatient Prescriptions on File Prior to Visit  Medication Sig Dispense Refill  . Multiple Vitamin (MULTIVITAMIN) tablet Take 1  tablet by mouth daily.    . ferrous sulfate 325 (65 FE) MG tablet Take 1 tablet (325 mg total) by mouth daily with breakfast. (Patient not taking: Reported on 07/03/2014) 30 tablet 3  . norethindrone-ethinyl estradiol-iron (MICROGESTIN FE,GILDESS FE,LOESTRIN FE) 1.5-30 MG-MCG tablet Take 1 tablet by mouth daily. (Patient not taking: Reported on 07/03/2014) 1 Package 11   No current facility-administered medications on file prior to visit.    BP 118/70 mmHg  Pulse 65  Temp(Src) 98.4 F (36.9 C) (Oral)  Ht  (1.651 m)  Wt 205 lb 1.9 oz (93.042 kg)  BMI 34.13 kg/m2  SpO2 98%  LMP  06/26/2014    Objective:   Physical Exam  Constitutional: She is oriented to person, place, and time.  Does not appear sickly.  Neck: Neck supple. No thyromegaly present.  Cardiovascular: Normal rate and regular rhythm.   Pulmonary/Chest: Effort normal and breath sounds normal.  Abdominal: Soft. Bowel sounds are normal. There is tenderness in the left lower quadrant. There is no guarding and no CVA tenderness.  Musculoskeletal: Normal range of motion.  Tender to muscle over right lower back.  Lymphadenopathy:    She has no cervical adenopathy.  Neurological: She is alert and oriented to person, place, and time.  Skin: Skin is warm and dry.  Psychiatric: She has a normal mood and affect.          Assessment & Plan:  Low back pain:  No injury or trauma. No flank pain. Tender to deep palpation of right lower muscle. Ibuprofen for pain and inflammation, occasional heat.

## 2014-07-03 NOTE — Assessment & Plan Note (Signed)
Suspect GERD due to coughing, epigastric burning with radiation to throat, and fullness. Started Zantac 150mg  once daily. Education provided on triggers. Encouraged her to remain upright at least 2 hours after last meal of the night. Follow up in 3 weeks.

## 2014-07-03 NOTE — Patient Instructions (Signed)
Start Zantac 150 mg daily for reflux symptoms. You may use ibuprofen for your back pain and apply heat as needed. You will be contacted regarding your referral to Dermatology. Follow up with either myself or Dr. Dayton MartesAron in 3 weeks for re-evaluations of reflux symptoms.  Gastroesophageal Reflux Disease, Adult Gastroesophageal reflux disease (GERD) happens when acid from your stomach flows up into the esophagus. When acid comes in contact with the esophagus, the acid causes soreness (inflammation) in the esophagus. Over time, GERD may create small holes (ulcers) in the lining of the esophagus. CAUSES   Increased body weight. This puts pressure on the stomach, making acid rise from the stomach into the esophagus.  Smoking. This increases acid production in the stomach.  Drinking alcohol. This causes decreased pressure in the lower esophageal sphincter (valve or ring of muscle between the esophagus and stomach), allowing acid from the stomach into the esophagus.  Late evening meals and a full stomach. This increases pressure and acid production in the stomach.  A malformed lower esophageal sphincter. Sometimes, no cause is found. SYMPTOMS   Burning pain in the lower part of the mid-chest behind the breastbone and in the mid-stomach area. This may occur twice a week or more often.  Trouble swallowing.  Sore throat.  Dry cough.  Asthma-like symptoms including chest tightness, shortness of breath, or wheezing. DIAGNOSIS  Your caregiver may be able to diagnose GERD based on your symptoms. In some cases, X-rays and other tests may be done to check for complications or to check the condition of your stomach and esophagus. TREATMENT  Your caregiver may recommend over-the-counter or prescription medicines to help decrease acid production. Ask your caregiver before starting or adding any new medicines.  HOME CARE INSTRUCTIONS   Change the factors that you can control. Ask your caregiver for  guidance concerning weight loss, quitting smoking, and alcohol consumption.  Avoid foods and drinks that make your symptoms worse, such as:  Caffeine or alcoholic drinks.  Chocolate.  Peppermint or mint flavorings.  Garlic and onions.  Spicy foods.  Citrus fruits, such as oranges, lemons, or limes.  Tomato-based foods such as sauce, chili, salsa, and pizza.  Fried and fatty foods.  Avoid lying down for the 3 hours prior to your bedtime or prior to taking a nap.  Eat small, frequent meals instead of large meals.  Wear loose-fitting clothing. Do not wear anything tight around your waist that causes pressure on your stomach.  Raise the head of your bed 6 to 8 inches with wood blocks to help you sleep. Extra pillows will not help.  Only take over-the-counter or prescription medicines for pain, discomfort, or fever as directed by your caregiver.  Do not take aspirin, ibuprofen, or other nonsteroidal anti-inflammatory drugs (NSAIDs). SEEK IMMEDIATE MEDICAL CARE IF:   You have pain in your arms, neck, jaw, teeth, or back.  Your pain increases or changes in intensity or duration.  You develop nausea, vomiting, or sweating (diaphoresis).  You develop shortness of breath, or you faint.  Your vomit is green, yellow, black, or looks like coffee grounds or blood.  Your stool is red, bloody, or black. These symptoms could be signs of other problems, such as heart disease, gastric bleeding, or esophageal bleeding. MAKE SURE YOU:   Understand these instructions.  Will watch your condition.  Will get help right away if you are not doing well or get worse. Document Released: 12/17/2004 Document Revised: 06/01/2011 Document Reviewed: 09/26/2010 Exit[employer] Patient Information 2015  [employer]. This information is not intended to replace advice given to you by your health care provider. Make sure you discuss any questions you have with your health care provider.   Food Choices  for Gastroesophageal Reflux Disease When you have gastroesophageal reflux disease (GERD), the foods you eat and your eating habits are very important. Choosing the right foods can help ease the discomfort of GERD. WHAT GENERAL GUIDELINES DO I NEED TO FOLLOW?  Choose fruits, vegetables, whole grains, low-fat dairy products, and low-fat meat, fish, and poultry.  Limit fats such as oils, salad dressings, butter, nuts, and avocado.  Keep a food diary to identify foods that cause symptoms.  Avoid foods that cause reflux. These may be different for different people.  Eat frequent small meals instead of three large meals each day.  Eat your meals slowly, in a relaxed setting.  Limit fried foods.  Cook foods using methods other than frying.  Avoid drinking alcohol.  Avoid drinking large amounts of liquids with your meals.  Avoid bending over or lying down until 2-3 hours after eating. WHAT FOODS ARE NOT RECOMMENDED? The following are some foods and drinks that may worsen your symptoms: Vegetables Tomatoes. Tomato juice. Tomato and spaghetti sauce. Chili peppers. Onion and garlic. Horseradish. Fruits Oranges, grapefruit, and lemon (fruit and juice). Meats High-fat meats, fish, and poultry. This includes hot dogs, ribs, ham, sausage, salami, and bacon. Dairy Whole milk and chocolate milk. Sour cream. Cream. Butter. Ice cream. Cream cheese.  Beverages Coffee and tea, with or without caffeine. Carbonated beverages or energy drinks. Condiments Hot sauce. Barbecue sauce.  Sweets/Desserts Chocolate and cocoa. Donuts. Peppermint and spearmint. Fats and Oils High-fat foods, including [place] fries and potato chips. Other Vinegar. Strong spices, such as black pepper, Maneri pepper, red pepper, cayenne, curry powder, cloves, ginger, and chili powder. The items listed above may not be a complete list of foods and beverages to avoid. Contact your dietitian for more information. Document  Released: 03/09/2005 Document Revised: 03/14/2013 Document Reviewed: 01/11/2013 [facility] Patient Information 2015 [place], [place]. This information is not intended to replace advice given to you by your health care provider. Make sure you discuss any questions you have with your health care provider.

## 2014-07-11 ENCOUNTER — Ambulatory Visit (INDEPENDENT_AMBULATORY_CARE_PROVIDER_SITE_OTHER): Payer: BLUE CROSS/BLUE SHIELD | Admitting: Family Medicine

## 2014-07-11 ENCOUNTER — Encounter: Payer: Self-pay | Admitting: Family Medicine

## 2014-07-11 ENCOUNTER — Telehealth: Payer: Self-pay | Admitting: Family Medicine

## 2014-07-11 VITALS — BP 110/72 | HR 51 | Temp 98.1°F | Wt 205.0 lb

## 2014-07-11 DIAGNOSIS — M791 Myalgia, unspecified site: Secondary | ICD-10-CM | POA: Insufficient documentation

## 2014-07-11 DIAGNOSIS — L0293 Carbuncle, unspecified: Secondary | ICD-10-CM

## 2014-07-11 DIAGNOSIS — R202 Paresthesia of skin: Secondary | ICD-10-CM | POA: Diagnosis not present

## 2014-07-11 MED ORDER — SULFAMETHOXAZOLE-TRIMETHOPRIM 800-160 MG PO TABS: 1.0000 | ORAL_TABLET | Freq: Two times a day (BID) | ORAL | Status: DC

## 2014-07-11 NOTE — Patient Instructions (Signed)
Good to see you. Take Bactrim as directed- 1 tablet twice daily for 1 week.  Please call me if no improvement in the next 48 hours.

## 2014-07-11 NOTE — Telephone Encounter (Signed)
Pt has appt to see Dr Dayton MartesAron today at 12 noon.

## 2014-07-11 NOTE — Telephone Encounter (Signed)
[facility] - Client TELEPHONE ADVICE RECORD [facility]  Patient Name: Idonna Lynds  DOB: 04-28-1985    Initial Comment Caller states, she has nerve pains in arms/legs/ feet, now she has Rt arm numbness, she has nerve pains in both arms in different areas. Headaches, and muscle spasms in behind her ear on Rt side. -- was seen alst week for boils under arm, and back pain, acid reflux, -- she sees NP Chestine Sporelark   Nurse Assessment      Guidelines    Guideline Title Affirmed Question Affirmed Notes       Final Disposition User   Clinical Call [employer], Charity fundraiserN, Amy    Comments  CALLER PATIENT AND SHE STATES THAT SHE JUST SPOKE WITH SOMEONE AT  AND SHE HAS AN APPT AT THE OFFICE SET UP FOR 12 TODAY. NO NEED FOR TRIAGE OF THIS CALL. WILL CLOSE THIS CALL. SHE WILL CALL BACK IF NEEDED.

## 2014-07-11 NOTE — Assessment & Plan Note (Signed)
New with paresthesia in the setting of recurrent boils/hydradenitis. ? Systemic response to having multiple active boils which may be infected but not visible on surface. Treat with Bactrim Ds- 1 tab twice daily x 7 days, keep appointment with dermatology. Call or return to clinic prn if these symptoms worsen or fail to improve as anticipated. The patient indicates understanding of these issues and agrees with the plan. If paresthesias do not resolve, will proceed with neuro work up.  No focal neurological findings on exam. The patient indicates understanding of these issues and agrees with the plan.

## 2014-07-11 NOTE — Progress Notes (Signed)
Pre visit review using our clinic review tool, if applicable. No additional management support is needed unless otherwise documented below in the visit note. 

## 2014-07-11 NOTE — Progress Notes (Signed)
Subjective:   Patient ID: Jillian Contreras, female    DOB: 25-Jan-1986, 29 y.o.   MRN: [id number]  Jillian Contreras is a pleasant 29 y.o. year old female who presents to clinic today with Numbness and Tingling  on 07/11/2014  HPI:  Saw Jerelene Redden last week for recurrent boils.  Note reviewed from 07/03/14. Advised to avoid strong deodorant and referred to dermatology- appt scheduled for next Friday.  Boils have become more painful and large.  Now having full body tingling and aching.  No fevers. No nausea or vomiting.  Has had recurrent boils and never had these symptoms with them although she has never had so many active boils at one time.  No focal neurological symptoms.  Current Outpatient Prescriptions on File Prior to Visit  Medication Sig Dispense Refill  . ferrous sulfate 325 (65 FE) MG tablet Take 1 tablet (325 mg total) by mouth daily with breakfast. 30 tablet 3  . Multiple Vitamin (MULTIVITAMIN) tablet Take 1 tablet by mouth daily.    . norethindrone-ethinyl estradiol-iron (MICROGESTIN FE,GILDESS FE,LOESTRIN FE) 1.5-30 MG-MCG tablet Take 1 tablet by mouth daily. 1 Package 11  . ranitidine (ZANTAC) 150 MG tablet Take 1 tablet (150 mg total) by mouth daily. 30 tablet 2   No current facility-administered medications on file prior to visit.    No Known Allergies  Past Medical History  Diagnosis Date  . Palpitations   . Palpitations   . Hypokalemia   . Chest pain     Past Surgical History  Procedure Laterality Date  . Cesarean section      Family History  Problem Relation Age of Onset  . Cancer Paternal Aunt 22    breast    History   Social History  . Marital Status: Single    Spouse Name: N/A  . Number of Children: N/A  . Years of Education: N/A   Occupational History  . customer service     [employer]   Social History Main Topics  . Smoking status: Never Smoker   . Smokeless tobacco: Never Used  . Alcohol Use: No  . Drug Use: No  .  Sexual Activity: Not Currently   Other Topics Concern  . Not on file   Social History Narrative   The PMH, PSH, Social History, Family History, Medications, and allergies have been reviewed in [facility], and have been updated if relevant.    Review of Systems  Constitutional: Negative for fever.  Respiratory: Negative.   Cardiovascular: Negative.   Gastrointestinal: Negative.   Genitourinary: Negative.   Musculoskeletal: Positive for myalgias.  Neurological: Positive for numbness. Negative for tremors, seizures, syncope, facial asymmetry, speech difficulty, weakness, light-headedness and headaches.  All other systems reviewed and are negative.      Objective:    BP 110/72 mmHg  Pulse 51  Temp(Src) 98.1 F (36.7 C) (Oral)  Wt 205 lb (92.987 kg)  SpO2 97%  LMP 06/26/2014   Physical Exam  Constitutional: She is oriented to person, place, and time. She appears well-developed and well-nourished. No distress.  HENT:  Head: Normocephalic.  Eyes: Conjunctivae are normal.  Cardiovascular: Normal rate and regular rhythm.   Pulmonary/Chest: Effort normal and breath sounds normal.  Musculoskeletal: Normal range of motion.  Neurological: She is alert and oriented to person, place, and time. No cranial nerve deficit.  Skin:  Multiple deep, subcutaneous, non fluctuant but tender boils- axilla bilaterally.  She says she had some in her groin area too that  are no longer tender.  Psychiatric: She has a normal mood and affect.  Nursing note and vitals reviewed.         Assessment & Plan:   Myalgia  Paresthesia  Recurrent boils No Follow-up on file.

## 2014-07-23 ENCOUNTER — Encounter: Payer: Self-pay | Admitting: Family Medicine

## 2014-07-23 ENCOUNTER — Encounter (HOSPITAL_COMMUNITY): Payer: Self-pay | Admitting: *Deleted

## 2014-07-23 ENCOUNTER — Emergency Department (HOSPITAL_COMMUNITY): Payer: BLUE CROSS/BLUE SHIELD

## 2014-07-23 ENCOUNTER — Telehealth: Payer: Self-pay | Admitting: Family Medicine

## 2014-07-23 ENCOUNTER — Emergency Department (HOSPITAL_COMMUNITY)
Admission: EM | Admit: 2014-07-23 | Discharge: 2014-07-23 | Disposition: A | Discharge status: Home / Self Care | Payer: BLUE CROSS/BLUE SHIELD | Attending: Emergency Medicine | Admitting: Emergency Medicine

## 2014-07-23 DIAGNOSIS — Z8639 Personal history of other endocrine, nutritional and metabolic disease: Secondary | ICD-10-CM | POA: Insufficient documentation

## 2014-07-23 DIAGNOSIS — Z79899 Other long term (current) drug therapy: Secondary | ICD-10-CM | POA: Insufficient documentation

## 2014-07-23 DIAGNOSIS — Z792 Long term (current) use of antibiotics: Secondary | ICD-10-CM | POA: Insufficient documentation

## 2014-07-23 DIAGNOSIS — R002 Palpitations: Secondary | ICD-10-CM | POA: Insufficient documentation

## 2014-07-23 DIAGNOSIS — F419 Anxiety disorder, unspecified: Secondary | ICD-10-CM | POA: Insufficient documentation

## 2014-07-23 DIAGNOSIS — R197 Diarrhea, unspecified: Secondary | ICD-10-CM | POA: Insufficient documentation

## 2014-07-23 DIAGNOSIS — R0602 Shortness of breath: Secondary | ICD-10-CM | POA: Diagnosis not present

## 2014-07-23 DIAGNOSIS — R079 Chest pain, unspecified: Secondary | ICD-10-CM | POA: Diagnosis not present

## 2014-07-23 DIAGNOSIS — F410 Panic disorder [episodic paroxysmal anxiety] without agoraphobia: Secondary | ICD-10-CM

## 2014-07-23 DIAGNOSIS — R110 Nausea: Secondary | ICD-10-CM | POA: Diagnosis not present

## 2014-07-23 DIAGNOSIS — R200 Anesthesia of skin: Secondary | ICD-10-CM | POA: Insufficient documentation

## 2014-07-23 HISTORY — DX: Anxiety disorder, unspecified: F41.9

## 2014-07-23 LAB — COMPREHENSIVE METABOLIC PANEL
ALBUMIN: 4 g/dL (ref 3.5–5.0)
ALK PHOS: 70 U/L (ref 38–126)
ALT: 22 U/L (ref 14–54)
AST: 18 U/L (ref 15–41)
Anion gap: 9 (ref 5–15)
BILIRUBIN TOTAL: 0.4 mg/dL (ref 0.3–1.2)
BUN: <5 mg/dL — ABNORMAL LOW (ref 6–20)
CHLORIDE: 107 mmol/L (ref 101–111)
CO2: 24 mmol/L (ref 22–32)
Calcium: 9.5 mg/dL (ref 8.9–10.3)
Creatinine, Ser: 0.78 mg/dL (ref 0.44–1.00)
Glucose, Bld: 99 mg/dL (ref 70–99)
Potassium: 3.9 mmol/L (ref 3.5–5.1)
SODIUM: 140 mmol/L (ref 135–145)
TOTAL PROTEIN: 7.5 g/dL (ref 6.5–8.1)

## 2014-07-23 LAB — CBC
HCT: 33.6 % — ABNORMAL LOW (ref 36.0–46.0)
Hemoglobin: 10.8 g/dL — ABNORMAL LOW (ref 12.0–15.0)
MCH: 25.8 pg — AB (ref 26.0–34.0)
MCHC: 32.1 g/dL (ref 30.0–36.0)
MCV: 80.4 fL (ref 78.0–100.0)
Platelets: 287 10*3/uL (ref 150–400)
RBC: 4.18 MIL/uL (ref 3.87–5.11)
RDW: 13.9 % (ref 11.5–15.5)
WBC: 6.8 10*3/uL (ref 4.0–10.5)

## 2014-07-23 LAB — TROPONIN I

## 2014-07-23 MED ORDER — LORAZEPAM 0.5 MG PO TABS: 0.5000 mg | ORAL_TABLET | Freq: Three times a day (TID) | ORAL | Status: DC | PRN

## 2014-07-23 NOTE — Telephone Encounter (Signed)
Patient Name: Jillian Contreras DOB: 1985/10/24 Initial Comment caller states she is having an anxiety and panic attack Nurse Assessment Nurse: Elijah Birkaldwell, RN, Stark BrayLynda Date/Time (Eastern Time): 07/23/2014 1:09:23 PM Confirm and document reason for call. If symptomatic, describe symptoms. ---Caller states she is having an anxiety and panic attack. Symptoms started this am at 2:30, woke her up. Felt heaviness of chest, HR beating fast, arms tingly, face numbness. Started at work again, tried to walk around. This is the third in a couple months. She is taking sulfamethoxazole for boils for the past 2 weeks. Thinks it could be a side effect of that. Was having nerve pain in arms & feet, thought it was related to the boils. The panic attack causes nausea & headache. Can't seem to shake it, doesn't want to go on medicine for it. Has the patient traveled out of the country within the last 30 days? ---Not Applicable Does the patient require triage? ---Yes Related visit to physician within the last 2 weeks? ---No Does the PT have any chronic conditions? (i.e. diabetes, asthma, etc.) ---No Did the patient indicate they were pregnant? ---No Guidelines Guideline Title Affirmed Question Affirmed Notes Chest Pain [1] Intermittent chest pain or "angina" AND [2] increasing in severity or frequency (Exception: pains lasting a few seconds) Final Disposition User Go to ED Now Elijah Birkaldwell, Charity fundraiserN, [employer]ys[employer]da

## 2014-07-23 NOTE — Telephone Encounter (Signed)
Spoke to pt who states that she is at the ED awaiting blood work.

## 2014-07-23 NOTE — Telephone Encounter (Signed)
Please call pt check on pt. 

## 2014-07-23 NOTE — ED Notes (Signed)
Pt states hx of panic attacks.  Woke up with chest pain, tingling in face and hands, shaking, nausea and light-headedness.  Pt got out of bed and paced and symptoms resolved.  This happened several times throughout the night.  Pt went to work, but the symptoms came back, so she came here.

## 2014-07-23 NOTE — Discharge Instructions (Signed)
Please read and follow all provided instructions.  Your diagnoses today include:  1. Anxiety attack    Tests performed today include:  An EKG of your heart - was normal  A chest x-ray - was normal  Cardiac enzymes - a blood test for heart muscle damage, no heart problem  Blood counts and electrolytes  Vital signs. See below for your results today.   Medications prescribed:   Ativan - medication to help with anxiety  Take any prescribed medications only as directed.  Follow-up instructions: Please follow-up with your primary care provider as soon as you can for further evaluation of your symptoms.   Return instructions:  SEEK IMMEDIATE MEDICAL ATTENTION IF:  You have severe chest pain, especially if the pain is crushing or pressure-like and spreads to the arms, back, neck, or jaw, or if you have sweating, nausea (feeling sick to your stomach), or shortness of breath. THIS IS AN EMERGENCY. Don't wait to see if the pain will go away. Get medical help at once. Call 911 or 0 (operator). DO NOT drive yourself to the hospital.   Your chest pain gets worse and does not go away with rest.   You have an attack of chest pain lasting longer than usual, despite rest and treatment with the medications your caregiver has prescribed.   You wake from sleep with chest pain or shortness of breath.  You feel dizzy or faint.  You have chest pain not typical of your usual pain for which you originally saw your caregiver.   You have any other emergent concerns regarding your health.  Additional Information: Chest pain comes from many different causes. Your caregiver has diagnosed you as having chest pain that is not specific for one problem, but does not require admission.  You are at low risk for an acute heart condition or other serious illness.   Your vital signs today were: BP 101/60 mmHg   Pulse 61   Temp(Src) 98.2 F (36.8 C) (Oral)   Resp 16   Ht 5\' 5"  (1.651 m)   Wt 201 lb (91.173  kg)   BMI 33.45 kg/m2   SpO2 98%   LMP 07/19/2014 If your blood pressure (BP) was elevated above 135/85 this visit, please have this repeated by your doctor within one month. --------------

## 2014-07-23 NOTE — ED Provider Notes (Signed)
CSN: [id number]     Arrival date & time 07/23/14  1424 History  This chart was scribed for non-physician practitioner Antony HasteJoshua Geible, PA-C working with Jerelyn ScottMartha Linker, MD by Murriel HopperAlec Bankhead, ED Scribe. This patient was seen in room TR08C/TR08C and the patient's care was started at 6:30 PM.    Chief Complaint  Patient presents with  . Panic Attack     The history is provided by the patient. No language interpreter was used.     HPI Comments: Jillian Contreras is a 29 y.o. female who presents to the Emergency Department complaining of intermittent panic attacks that have been present since 0230 this morning. Pt notes that she rose suddenly out of bed and had SOB, palpitations, light-headedness, dizziness, nausea, and numbness to her fingers and face and began to pace back and forth across the room. Pt denies any known reason for the onset of the panic attack. Pt reports she paced across her house for 1.5 hours and eventually went to sleep for a few hours before she went to work in the morning. Pt reports having intermittent episodes of similar symptoms a few times while she was at work this morning and eventually had to leave to come here. Pt also reports having diarrhea once last night. No treatments PTA. Patient reports increased stress at home with family living at her house and the recent death of a relative. Patient does drink caffeine. No recent cold medications.    Past Medical History  Diagnosis Date  . Palpitations   . Palpitations   . Hypokalemia   . Chest pain   . Anxiety    Past Surgical History  Procedure Laterality Date  . Cesarean section     Family History  Problem Relation Age of Onset  . Cancer Paternal Aunt 4545    breast   History  Substance Use Topics  . Smoking status: Never Smoker   . Smokeless tobacco: Never Used  . Alcohol Use: No   OB History    No data available     Review of Systems  Constitutional: Negative for fever and diaphoresis.  Eyes: Negative for  redness.  Respiratory: Positive for shortness of breath. Negative for cough.   Cardiovascular: Positive for chest pain and palpitations. Negative for leg swelling.  Gastrointestinal: Positive for nausea and diarrhea. Negative for vomiting and abdominal pain.  Genitourinary: Negative for dysuria.  Musculoskeletal: Negative for back pain and neck pain.  Skin: Negative for rash.  Neurological: Positive for dizziness, light-headedness and numbness. Negative for syncope.  Psychiatric/Behavioral: The patient is nervous/anxious.       Allergies  Review of patient's allergies indicates no known allergies.  Home Medications   Prior to Admission medications   Medication Sig Start Date End Date Taking? Authorizing Provider  ferrous sulfate 325 (65 FE) MG tablet Take 1 tablet (325 mg total) by mouth daily with breakfast. 09/11/13   Dianne Dunalia M Aron, MD  Multiple Vitamin (MULTIVITAMIN) tablet Take 1 tablet by mouth daily.    Historical Provider, MD  norethindrone-ethinyl estradiol-iron (MICROGESTIN FE,GILDESS FE,LOESTRIN FE) 1.5-30 MG-MCG tablet Take 1 tablet by mouth daily. 09/11/13   Dianne Dunalia M Aron, MD  ranitidine (ZANTAC) 150 MG tablet Take 1 tablet (150 mg total) by mouth daily. 07/03/14   Vernona RiegerKatherine Clark, NP  sulfamethoxazole-trimethoprim (BACTRIM DS,SEPTRA DS) 800-160 MG per tablet Take 1 tablet by mouth 2 (two) times daily. 07/11/14   Dianne Dunalia M Aron, MD   BP 101/60 mmHg  Pulse 61  Temp(Src) 98.2  F (36.8 C) (Oral)  Resp 16  Ht  (1.651 m)  Wt 201 lb (91.173 kg)  BMI 33.45 kg/m2  SpO2 98%  LMP 07/19/2014   Physical Exam  Constitutional: She is oriented to person, place, and time. She appears well-developed and well-nourished.  HENT:  Head: Normocephalic and atraumatic.  Mouth/Throat: Mucous membranes are normal. Mucous membranes are not dry.  Eyes: Conjunctivae are normal.  Neck: Trachea normal and normal range of motion. Neck supple. Normal carotid pulses and no JVD present. No muscular  tenderness present. Carotid bruit is not present. No tracheal deviation present.  Cardiovascular: Normal rate, regular rhythm, S1 normal, S2 normal, normal heart sounds and intact distal pulses.  Exam reveals no decreased pulses.   No murmur heard. Pulmonary/Chest: Effort normal. No respiratory distress. She has no wheezes. She exhibits no tenderness.  Abdominal: Soft. Normal aorta and bowel sounds are normal. She exhibits no distension. There is no tenderness. There is no rebound and no guarding.  Musculoskeletal: Normal range of motion.  Neurological: She is alert and oriented to person, place, and time.  Skin: Skin is warm and dry. She is not diaphoretic. No cyanosis. No pallor.  Psychiatric: She has a normal mood and affect.  Nursing note and vitals reviewed.   ED Course  Procedures (including critical care time)  DIAGNOSTIC STUDIES: Oxygen Saturation is 98% on room air, normal by my interpretation.    COORDINATION OF CARE: 6:40 PM Discussed treatment plan with pt at bedside and pt agreed to plan.   Labs Review Labs Reviewed  CBC - Abnormal; Notable for the following:    Hemoglobin 10.8 (*)    HCT 33.6 (*)    MCH 25.8 (*)    All other components within normal limits  COMPREHENSIVE METABOLIC PANEL - Abnormal; Notable for the following:    BUN <5 (*)    All other components within normal limits  TROPONIN I    Imaging Review Dg Chest 2 View  07/23/2014   CLINICAL DATA:  Shortness of breath, chest pain and anxiety today.  EXAM: CHEST  2 VIEW  COMPARISON:  CT chest 01/20/2013.  FINDINGS: Heart size and mediastinal contours are within normal limits. Both lungs are clear. Visualized skeletal structures are unremarkable.  IMPRESSION: Negative exam.   Electronically Signed   By: Drusilla Kanner M.D.   On: 07/23/2014 15:32      ED ECG REPORT   Date: 07/23/2014  Rate: 62  Rhythm: normal sinus rhythm  QRS Axis: normal  Intervals: normal  ST/T Wave abnormalities: normal   Conduction Disutrbances:none  Narrative Interpretation:   Old EKG Reviewed: unchanged from 01/2013  I have personally reviewed the EKG tracing and agree with the computerized printout as noted.   Vital signs reviewed and are as follows: Filed Vitals:   07/23/14 1859  BP: 113/64  Pulse: 58  Temp:   Resp: 18   Patient informed of all results. Will give #6 Ativan 0.5 mg tablets for home in case her symptoms become uncontrolled once again. Patient encouraged to follow-up with her primary care physician for further evaluation and treatment.  MDM   Final diagnoses:  Anxiety attack   Patient with history consistent with anxiety attack. Patient had some other related symptoms such as tingling in chest pain with this. Chest pain workup is negative. EKG is not concerning. Do not feel patient was having an arrhythmia. No syncopal episode. No concern for cardiac etiology of chest pain. Patient is now back at  her baseline.   I personally performed the services described in this documentation, which was scribed in my presence. The recorded information has been reviewed and is accurate.    Renne Crigler, PA-C 07/23/14 1921  Jerelyn Scott, MD 07/23/14 [phone]

## 2014-09-13 ENCOUNTER — Telehealth: Payer: Self-pay | Admitting: Family Medicine

## 2014-09-13 ENCOUNTER — Encounter: Payer: Self-pay | Admitting: Family Medicine

## 2014-09-13 ENCOUNTER — Ambulatory Visit (INDEPENDENT_AMBULATORY_CARE_PROVIDER_SITE_OTHER): Payer: BLUE CROSS/BLUE SHIELD | Admitting: Family Medicine

## 2014-09-13 ENCOUNTER — Other Ambulatory Visit (HOSPITAL_COMMUNITY)
Admission: RE | Admit: 2014-09-13 | Discharge: 2014-09-13 | Disposition: A | Discharge status: Home / Self Care | Payer: BLUE CROSS/BLUE SHIELD | Source: Ambulatory Visit | Attending: Family Medicine | Admitting: Family Medicine

## 2014-09-13 VITALS — BP 124/68 | HR 60 | Temp 97.9°F | Ht 65.0 in | Wt 194.2 lb

## 2014-09-13 DIAGNOSIS — N760 Acute vaginitis: Secondary | ICD-10-CM | POA: Diagnosis present

## 2014-09-13 DIAGNOSIS — F4323 Adjustment disorder with mixed anxiety and depressed mood: Secondary | ICD-10-CM

## 2014-09-13 DIAGNOSIS — N644 Mastodynia: Secondary | ICD-10-CM

## 2014-09-13 DIAGNOSIS — F411 Generalized anxiety disorder: Secondary | ICD-10-CM | POA: Insufficient documentation

## 2014-09-13 DIAGNOSIS — Z0000 Encounter for general adult medical examination without abnormal findings: Secondary | ICD-10-CM | POA: Diagnosis not present

## 2014-09-13 DIAGNOSIS — D649 Anemia, unspecified: Secondary | ICD-10-CM

## 2014-09-13 DIAGNOSIS — R12 Heartburn: Secondary | ICD-10-CM

## 2014-09-13 DIAGNOSIS — Z01419 Encounter for gynecological examination (general) (routine) without abnormal findings: Secondary | ICD-10-CM

## 2014-09-13 DIAGNOSIS — Z1151 Encounter for screening for human papillomavirus (HPV): Secondary | ICD-10-CM | POA: Insufficient documentation

## 2014-09-13 DIAGNOSIS — Z113 Encounter for screening for infections with a predominantly sexual mode of transmission: Secondary | ICD-10-CM | POA: Diagnosis not present

## 2014-09-13 LAB — COMPREHENSIVE METABOLIC PANEL
ALT: 17 U/L (ref 0–35)
AST: 15 U/L (ref 0–37)
Albumin: 4.3 g/dL (ref 3.5–5.2)
Alkaline Phosphatase: 69 U/L (ref 39–117)
BILIRUBIN TOTAL: 0.2 mg/dL (ref 0.2–1.2)
BUN: 11 mg/dL (ref 6–23)
CALCIUM: 9.5 mg/dL (ref 8.4–10.5)
CHLORIDE: 105 meq/L (ref 96–112)
CO2: 28 mEq/L (ref 19–32)
CREATININE: 0.78 mg/dL (ref 0.40–1.20)
GFR: 111.23 mL/min (ref >60.00)
Glucose, Bld: 93 mg/dL (ref 70–99)
Potassium: 3.9 mEq/L (ref 3.5–5.1)
Sodium: 138 mEq/L (ref 135–145)
TOTAL PROTEIN: 7.6 g/dL (ref 6.0–8.3)

## 2014-09-13 LAB — CBC WITH DIFFERENTIAL/PLATELET
Basophils Absolute: 0 10*3/uL (ref 0.0–0.1)
Basophils Relative: 0.4 % (ref 0.0–3.0)
EOS ABS: 0.1 10*3/uL (ref 0.0–0.7)
Eosinophils Relative: 1.7 % (ref 0.0–5.0)
HCT: 33.8 % — ABNORMAL LOW (ref 36.0–46.0)
HEMOGLOBIN: 10.9 g/dL — AB (ref 12.0–15.0)
Lymphocytes Relative: 41.7 % (ref 12.0–46.0)
Lymphs Abs: 2.9 10*3/uL (ref 0.7–4.0)
MCHC: 32.3 g/dL (ref 30.0–36.0)
MCV: 80.7 fl (ref 78.0–100.0)
MONOS PCT: 6.9 % (ref 3.0–12.0)
Monocytes Absolute: 0.5 10*3/uL (ref 0.1–1.0)
Neutro Abs: 3.4 10*3/uL (ref 1.4–7.7)
Neutrophils Relative %: 49.3 % (ref 43.0–77.0)
PLATELETS: 319 10*3/uL (ref 150.0–400.0)
RBC: 4.19 Mil/uL (ref 3.87–5.11)
RDW: 14.7 % (ref 11.5–15.5)
WBC: 7 10*3/uL (ref 4.0–10.5)

## 2014-09-13 LAB — LIPID PANEL
CHOLESTEROL: 192 mg/dL (ref 0–200)
HDL: 38.3 mg/dL — ABNORMAL LOW (ref >39.00)
LDL Cholesterol: 123 mg/dL — ABNORMAL HIGH (ref 0–99)
NonHDL: 153.7
Total CHOL/HDL Ratio: 5
Triglycerides: 152 mg/dL — ABNORMAL HIGH (ref 0.0–149.0)
VLDL: 30.4 mg/dL (ref 0.0–40.0)

## 2014-09-13 LAB — TSH: TSH: 2.14 u[IU]/mL (ref 0.35–4.50)

## 2014-09-13 NOTE — Assessment & Plan Note (Signed)
New- exam reassuring- no masses palpable. Will order [place] and diag mammo for further evaluation. The patient indicates understanding of these issues and agrees with the plan.

## 2014-09-13 NOTE — Assessment & Plan Note (Signed)
Reviewed preventive care protocols, scheduled due services, and updated immunizations Discussed nutrition, exercise, diet, and healthy lifestyle.  

## 2014-09-13 NOTE — Progress Notes (Signed)
Pre visit review using our clinic review tool, if applicable. No additional management support is needed unless otherwise documented below in the visit note. 

## 2014-09-13 NOTE — Patient Instructions (Signed)
Good to see you. We will call you with your lab results. Please stop by to see Marion on your way out. 

## 2014-09-13 NOTE — Progress Notes (Signed)
Subjective:    Patient ID: Jillian Contreras, female    DOB: 07-08-1985, 29 y.o.   MRN: [id number]  HPI  29 yo pleasant female here for CPX and follow up of chronic medical conditions. Last pap smear done by me on 09/11/2013- neg. Did have an abnormal pap smear a few years.  She is currently sexually active.  Having more breast pain with menses. Pain improved but can still feel soreness on left.  Recurrent boils- Saw Jerelene Redden last week for recurrent boils on 07/03/14.  Note reviewed. Advised to avoid strong deodorant and referred to dermatology- cancelled appointment.  Symptoms have improved.  Lab Results  Component Value Date   CHOL 193 09/04/2013   HDL 41.40 09/04/2013   LDLCALC 130* 09/04/2013   LDLDIRECT 149.6 08/20/2011   TRIG 108.0 09/04/2013   CHOLHDL 5 09/04/2013   Lab Results  Component Value Date   WBC 6.8 07/23/2014   HGB 10.8* 07/23/2014   HCT 33.6* 07/23/2014   MCV 80.4 07/23/2014   PLT 287 07/23/2014   Lab Results  Component Value Date   CREATININE 0.78 07/23/2014     Doing well, still losing weight.  Exercising and trying to eat right.  Wt Readings from Last 3 Encounters:  09/13/14 194 lb 4 oz (88.111 kg)  07/23/14 201 lb (91.173 kg)  07/11/14 205 lb (92.987 kg)   Patient Active Problem List   Diagnosis Date Noted  . Adjustment disorder with mixed anxiety and depressed mood 09/13/2014  . Breast pain 09/13/2014  . Myalgia 07/11/2014  . Paresthesia 07/11/2014  . Burning reflux 07/03/2014  . Recurrent boils 07/03/2014  . Anemia 09/11/2013  . Menorrhagia 09/11/2013  . Hyperglycemia 09/11/2013  . Routine general medical examination at a health care facility 08/20/2011  . Encounter for routine gynecological examination 08/20/2011   Past Medical History  Diagnosis Date  . Palpitations   . Palpitations   . Hypokalemia   . Chest pain   . Anxiety    Past Surgical History  Procedure Laterality Date  . Cesarean section     History   Substance Use Topics  . Smoking status: Never Smoker   . Smokeless tobacco: Never Used  . Alcohol Use: No   Family History  Problem Relation Age of Onset  . Cancer Paternal Aunt 58    breast   No Known Allergies Current Outpatient Prescriptions on File Prior to Visit  Medication Sig Dispense Refill  . Multiple Vitamin (MULTIVITAMIN) tablet Take 1 tablet by mouth daily.    . ranitidine (ZANTAC) 150 MG tablet Take 1 tablet (150 mg total) by mouth daily. 30 tablet 2   No current facility-administered medications on file prior to visit.   The PMH, PSH, Social History, Family History, Medications, and allergies have been reviewed in [facility], and have been updated if relevant.        Review of Systems  Constitutional: Negative.   HENT: Negative.   Respiratory: Negative.   Cardiovascular: Negative.   Gastrointestinal: Negative.   Endocrine: Negative.   Genitourinary: Negative.   Musculoskeletal: Negative.   Skin: Negative.   Allergic/Immunologic: Negative.   Neurological: Negative.   Hematological: Negative.   Psychiatric/Behavioral: Negative.   All other systems reviewed and are negative.     Objective:   Physical Exam BP 124/68 mmHg  Pulse 60  Temp(Src) 97.9 F (36.6 C) (Oral)  Ht  (1.651 m)  Wt 194 lb 4 oz (88.111 kg)  BMI 32.32 kg/m2  SpO2 98%  LMP 09/08/2014 Wt Readings from Last 3 Encounters:  09/13/14 194 lb 4 oz (88.111 kg)  07/23/14 201 lb (91.173 kg)  07/11/14 205 lb (92.987 kg)   Physical Exam   General:  Well-developed,well-nourished,in no acute distress; alert,appropriate and cooperative throughout examination Head:  normocephalic and atraumatic.   Eyes:  vision grossly intact, pupils equal, pupils round, and pupils reactive to light.   Ears:  R ear normal and L ear normal.   Nose:  no external deformity.   Mouth:  good dentition.   Neck:  No deformities, masses, or tenderness noted. Breasts:  No mass, nodules, thickening, tenderness,  bulging, retraction, inflamation, nipple discharge or skin changes noted.   Lungs:  Normal respiratory effort, chest expands symmetrically. Lungs are clear to auscultation, no crackles or wheezes. Heart:  Normal rate and regular rhythm. S1 and S2 normal without gallop, murmur, click, rub or other extra sounds. Abdomen:  Bowel sounds positive,abdomen soft and non-tender without masses, organomegaly or hernias noted. Rectal:  no external abnormalities.   Genitalia:  Pelvic Exam:        External: normal female genitalia without lesions or masses        Vagina: normal without lesions or masses        Cervix: normal without lesions or masses        Adnexa: normal bimanual exam without masses or fullness        Uterus: normal by palpation        Pap smear: performed Msk:  No deformity or scoliosis noted of thoracic or lumbar spine.   Extremities:  No clubbing, cyanosis, edema, or deformity noted with normal full range of motion of all joints.   Neurologic:  alert & oriented X3 and gait normal.   Skin:  Intact without suspicious lesions or rashes Cervical Nodes:  No lymphadenopathy noted Axillary Nodes:  No palpable lymphadenopathy Psych:  Cognition and judgment appear intact. Alert and cooperative with normal attention span and concentration. No apparent delusions, illusions, hallucinations     Assessment & Plan:

## 2014-09-13 NOTE — Telephone Encounter (Signed)
[facility] needs you change the mammogram orders. B/C the pt is 29  You need to put in a Bilateral diagnostic mammogram, also add a right breast US in case they decide to do one. Also please add to the left breast US order the position where the pain is-between 1and2 oclock. Please put order in and patient will call [facility] tomorrow to set these up.

## 2014-09-13 NOTE — Telephone Encounter (Signed)
Orders entered as requested and uni diag mammo order cancelled.

## 2014-09-13 NOTE — Assessment & Plan Note (Signed)
Discussed USPSTF recommendations of cervical cancer screening.  She is aware that interval of 3 years is recommended but pt would prefer to have pap smear done today.  

## 2014-09-14 ENCOUNTER — Encounter: Payer: Self-pay | Admitting: *Deleted

## 2014-09-14 LAB — RPR

## 2014-09-14 LAB — CYTOLOGY - PAP

## 2014-09-14 LAB — HIV ANTIBODY (ROUTINE TESTING W REFLEX): HIV 1&2 Ab, 4th Generation: NONREACTIVE

## 2014-09-18 LAB — CERVICOVAGINAL ANCILLARY ONLY: Herpes: NEGATIVE

## 2014-09-19 ENCOUNTER — Ambulatory Visit: Payer: BLUE CROSS/BLUE SHIELD | Admitting: Family Medicine

## 2014-09-19 LAB — CERVICOVAGINAL ANCILLARY ONLY: Candida vaginitis: NEGATIVE

## 2014-09-19 MED ORDER — METRONIDAZOLE 500 MG PO TABS: 500.0000 mg | ORAL_TABLET | Freq: Three times a day (TID) | ORAL | Status: DC

## 2014-09-19 MED ORDER — METRONIDAZOLE 500 MG PO TABS: 500.0000 mg | ORAL_TABLET | Freq: Two times a day (BID) | ORAL | Status: DC

## 2014-09-19 NOTE — Addendum Note (Signed)
Addended by: Desmond DikeKNIGHT, Arath Kaigler H on: 09/19/2014 04:08 PM   Modules accepted: Orders

## 2014-09-19 NOTE — Addendum Note (Signed)
Addended by: Desmond DikeKNIGHT, Yannick Steuber H on: 09/19/2014 04:17 PM   Modules accepted: Orders

## 2014-09-20 ENCOUNTER — Ambulatory Visit: Payer: BLUE CROSS/BLUE SHIELD

## 2014-09-20 ENCOUNTER — Ambulatory Visit
Admission: RE | Admit: 2014-09-20 | Discharge: 2014-09-20 | Disposition: A | Discharge status: Home / Self Care | Payer: BLUE CROSS/BLUE SHIELD | Source: Ambulatory Visit | Attending: Family Medicine | Admitting: Family Medicine

## 2014-09-20 ENCOUNTER — Other Ambulatory Visit: Payer: Self-pay | Admitting: Family Medicine

## 2014-09-20 DIAGNOSIS — Z803 Family history of malignant neoplasm of breast: Secondary | ICD-10-CM | POA: Diagnosis not present

## 2014-09-20 DIAGNOSIS — N644 Mastodynia: Secondary | ICD-10-CM

## 2014-10-04 ENCOUNTER — Encounter: Payer: Self-pay | Admitting: Family Medicine

## 2014-12-25 ENCOUNTER — Telehealth: Payer: Self-pay

## 2014-12-25 MED ORDER — LORAZEPAM 0.5 MG PO TABS: 0.5000 mg | ORAL_TABLET | Freq: Three times a day (TID) | ORAL | Status: DC

## 2014-12-25 NOTE — Telephone Encounter (Signed)
Pt left v/m; pt was seen in [facility] ED 07/2014 for anxiety and given small amt of ativan; pt did not get rx filled. Pt request rx for ativan to [facility]. Annual exam on 09/13/14.Please advise.

## 2014-12-25 NOTE — Telephone Encounter (Signed)
Ok to send in rx as entered.

## 2014-12-26 NOTE — Telephone Encounter (Signed)
Rx called in to requested pharmacy 

## 2014-12-27 ENCOUNTER — Encounter: Payer: Self-pay | Admitting: Family Medicine

## 2014-12-27 ENCOUNTER — Ambulatory Visit (INDEPENDENT_AMBULATORY_CARE_PROVIDER_SITE_OTHER): Payer: BLUE CROSS/BLUE SHIELD | Admitting: Family Medicine

## 2014-12-27 VITALS — BP 90/60 | HR 51 | Temp 98.2°F | Ht 65.0 in | Wt 191.2 lb

## 2014-12-27 DIAGNOSIS — G4700 Insomnia, unspecified: Secondary | ICD-10-CM | POA: Diagnosis not present

## 2014-12-27 DIAGNOSIS — M5410 Radiculopathy, site unspecified: Secondary | ICD-10-CM

## 2014-12-27 DIAGNOSIS — F410 Panic disorder [episodic paroxysmal anxiety] without agoraphobia: Secondary | ICD-10-CM | POA: Insufficient documentation

## 2014-12-27 DIAGNOSIS — F411 Generalized anxiety disorder: Secondary | ICD-10-CM | POA: Diagnosis not present

## 2014-12-27 DIAGNOSIS — M545 Low back pain, unspecified: Secondary | ICD-10-CM | POA: Insufficient documentation

## 2014-12-27 DIAGNOSIS — G8929 Other chronic pain: Secondary | ICD-10-CM | POA: Insufficient documentation

## 2014-12-27 HISTORY — DX: Panic disorder (episodic paroxysmal anxiety): F41.0

## 2014-12-27 NOTE — Progress Notes (Signed)
Subjective:    Patient ID: Jillian Contreras, female    DOB: [birth year], 29 y.o.   MRN: [id number]  HPI  29 year old female pt of Dr. Elmer Sow with history of adjustment disorder with depression and anxiety   presents  With the following acute issues.  1.back pain in low back in last few weeks. More on left lower than right.  Pain shoots down left lateral leg, pain to foot. Occ left foot goes numb.  Last night pain radiated to left groin, sharp shooting pain. No weakness, feels heavy some times.  Has not taken any med for it.  Has tried stretching and no heat.  No recent change in activity, no fall, no injury.  2. Anxiety, panic attacks : panic attacks daily, tearful at times, decreased motivation.  Dr. Dayton Martes refilled lorazepam prior to this appt.. She has not picked up yet. Unable to sleep at night. No SI, no HI.  She was given ativan at ER visit in 07/2014 She stays tense all the time, wonders if this causes her back issue.  Has never seen a Veterinary surgeon. Talks with pastor daily.  Social History /Family History/Past Medical History reviewed and updated if needed.   Review of Systems  Constitutional: Negative for fever and fatigue.  HENT: Negative for ear pain.   Eyes: Negative for pain.  Respiratory: Negative for chest tightness and shortness of breath.   Cardiovascular: Negative for chest pain, palpitations and leg swelling.  Gastrointestinal: Negative for abdominal pain.  Genitourinary: Negative for dysuria.       Objective:   Physical Exam  Constitutional: She is oriented to person, place, and time. Vital signs are normal. She appears well-developed and well-nourished. She is cooperative.  Non-toxic appearance. She does not appear ill. No distress.  HENT:  Head: Normocephalic.  Right Ear: Hearing, tympanic membrane, external ear and ear canal normal. Tympanic membrane is not erythematous, not retracted and not bulging.  Left Ear: Hearing, tympanic membrane, external ear  and ear canal normal. Tympanic membrane is not erythematous, not retracted and not bulging.  Nose: No mucosal edema or rhinorrhea. Right sinus exhibits no maxillary sinus tenderness and no frontal sinus tenderness. Left sinus exhibits no maxillary sinus tenderness and no frontal sinus tenderness.  Mouth/Throat: Uvula is midline, oropharynx is clear and moist and mucous membranes are normal.  Eyes: Conjunctivae, EOM and lids are normal. Pupils are equal, round, and reactive to light. Lids are everted and swept, no foreign bodies found.  Neck: Trachea normal and normal range of motion. Neck supple. Carotid bruit is not present. No thyroid mass and no thyromegaly present.  Cardiovascular: Normal rate, regular rhythm, S1 normal, S2 normal, normal heart sounds, intact distal pulses and normal pulses.  Exam reveals no gallop and no friction rub.   No murmur heard. Pulmonary/Chest: Effort normal and breath sounds normal. No tachypnea. No respiratory distress. She has no decreased breath sounds. She has no wheezes. She has no rhonchi. She has no rales.  Abdominal: Soft. Normal appearance and bowel sounds are normal. There is no tenderness.  Musculoskeletal:       Thoracic back: Normal. She exhibits normal range of motion and no tenderness.       Lumbar back: She exhibits decreased range of motion. She exhibits no tenderness and no bony tenderness.  Neg SLR bilaterally neg faber's  Neurological: She is alert and oriented to person, place, and time. She has normal strength. She displays no tremor. No cranial nerve deficit or  sensory deficit. She exhibits normal muscle tone. Coordination and gait normal.  Reflex Scores:      Patellar reflexes are 2+ on the right side and 2+ on the left side. Skin: Skin is warm, dry and intact. No rash noted.  Psychiatric: Her speech is normal and behavior is normal. Judgment and thought content normal. Her mood appears not anxious. Cognition and memory are normal. She does not  exhibit a depressed mood.   Calm and has good insight          Assessment & Plan:

## 2014-12-27 NOTE — Assessment & Plan Note (Signed)
Discused lorazepam bandaid med and temporary med. Recommended consideration of counsling and daily med if not improving. Pt will consider.

## 2014-12-27 NOTE — Assessment & Plan Note (Signed)
Reviewed healthy sleep hygiene. 

## 2014-12-27 NOTE — Progress Notes (Signed)
Pre visit review using our clinic review tool, if applicable. No additional management support is needed unless otherwise documented below in the visit note. 

## 2014-12-27 NOTE — Assessment & Plan Note (Signed)
Discussed healthy back habits. Start home stretching and in long term core strengthening.  Start diclofenac twice daily for pain and inflammation. Follow up with PCP if not improving in 2 weeks.

## 2014-12-27 NOTE — Patient Instructions (Addendum)
Okay to try lorazepam as PCP suggested for. If sleep and mod not improving in next 2 weeks.. Consider calling for referral to counseling and long term mood medication. Start home stretches for low back. Start diclofenac twice daily as for pain and inflammation.  Take with food and stop if any stomach pain.

## 2014-12-28 ENCOUNTER — Telehealth: Payer: Self-pay

## 2014-12-28 MED ORDER — DICLOFENAC SODIUM 75 MG PO TBEC: 75.0000 mg | DELAYED_RELEASE_TABLET | Freq: Two times a day (BID) | ORAL | Status: DC

## 2014-12-28 NOTE — Telephone Encounter (Signed)
Please apologize for me.. rx sent in.

## 2014-12-28 NOTE — Telephone Encounter (Signed)
Left message for Jillian Contreras that the prescription for Diclofenac has been sent to her pharmacy.

## 2014-12-28 NOTE — Telephone Encounter (Signed)
Pt left v/m; pt seen 12/27/14 and diclofenac was not at [facility]; in office note said would start diclofenac twice a day for pain and inflammation; could not send to pharmacy, needing mg and quantity. Pt request cb when refilled.

## 2014-12-31 ENCOUNTER — Emergency Department (HOSPITAL_COMMUNITY): Payer: BLUE CROSS/BLUE SHIELD

## 2014-12-31 ENCOUNTER — Emergency Department (HOSPITAL_COMMUNITY)
Admission: EM | Admit: 2014-12-31 | Discharge: 2014-12-31 | Disposition: A | Discharge status: Home / Self Care | Payer: BLUE CROSS/BLUE SHIELD | Attending: Emergency Medicine | Admitting: Emergency Medicine

## 2014-12-31 ENCOUNTER — Encounter (HOSPITAL_COMMUNITY): Payer: Self-pay | Admitting: Emergency Medicine

## 2014-12-31 DIAGNOSIS — R079 Chest pain, unspecified: Secondary | ICD-10-CM | POA: Diagnosis present

## 2014-12-31 DIAGNOSIS — F419 Anxiety disorder, unspecified: Secondary | ICD-10-CM | POA: Insufficient documentation

## 2014-12-31 DIAGNOSIS — R072 Precordial pain: Secondary | ICD-10-CM | POA: Diagnosis not present

## 2014-12-31 DIAGNOSIS — Z79899 Other long term (current) drug therapy: Secondary | ICD-10-CM | POA: Insufficient documentation

## 2014-12-31 DIAGNOSIS — R0602 Shortness of breath: Secondary | ICD-10-CM | POA: Diagnosis not present

## 2014-12-31 DIAGNOSIS — Z792 Long term (current) use of antibiotics: Secondary | ICD-10-CM | POA: Insufficient documentation

## 2014-12-31 DIAGNOSIS — R630 Anorexia: Secondary | ICD-10-CM | POA: Insufficient documentation

## 2014-12-31 LAB — BASIC METABOLIC PANEL
Anion gap: 11 (ref 5–15)
BUN: 6 mg/dL (ref 6–20)
CHLORIDE: 105 mmol/L (ref 101–111)
CO2: 23 mmol/L (ref 22–32)
CREATININE: 0.73 mg/dL (ref 0.44–1.00)
Calcium: 9.2 mg/dL (ref 8.9–10.3)
GFR calc Af Amer: >60 mL/min (ref >60)
GFR calc non Af Amer: >60 mL/min (ref >60)
Glucose, Bld: 111 mg/dL — ABNORMAL HIGH (ref 65–99)
Potassium: 4.1 mmol/L (ref 3.5–5.1)
SODIUM: 139 mmol/L (ref 135–145)

## 2014-12-31 LAB — CBC
HCT: 33.7 % — ABNORMAL LOW (ref 36.0–46.0)
Hemoglobin: 10.8 g/dL — ABNORMAL LOW (ref 12.0–15.0)
MCH: 25.7 pg — AB (ref 26.0–34.0)
MCHC: 32 g/dL (ref 30.0–36.0)
MCV: 80.2 fL (ref 78.0–100.0)
Platelets: 309 10*3/uL (ref 150–400)
RBC: 4.2 MIL/uL (ref 3.87–5.11)
RDW: 14.8 % (ref 11.5–15.5)
WBC: 6.2 10*3/uL (ref 4.0–10.5)

## 2014-12-31 LAB — I-STAT TROPONIN, ED
TROPONIN I, POC: 0 ng/mL (ref 0.00–0.08)
Troponin i, poc: 0.01 ng/mL (ref 0.00–0.08)

## 2014-12-31 NOTE — ED Provider Notes (Signed)
Care from Dr. Rhunette Croft. Troponin negative. Is bradycardic but asymptomatic.  Plan to discharge with outpatient follow up. Verbally discussed return precautions.  Pricilla Loveless, MD 12/31/14 [phone]

## 2014-12-31 NOTE — ED Notes (Signed)
Pt. reports  intermittent left chest pain for 1 week worse this morning with SOB , dry cough and nausea. Denies diaphoresis or fever.

## 2014-12-31 NOTE — ED Notes (Signed)
EDP notified on pt.'s bradycardia , will repeat troponin at 8 am .  Pt. Sleeping with no distress , respirations unlabored .

## 2014-12-31 NOTE — Discharge Instructions (Signed)
We saw you in the ER for the chest pain/shortness of breath. °All of our cardiac workup is normal, including labs, EKG and chest X-RAY are normal. °We are not sure what is causing your discomfort, but we feel comfortable sending you home at this time. The workup in the ER is not complete, and you should follow up with your primary care doctor for further evaluation. ° °Please return to the ER if you have worsening chest pain, shortness of breath, pain radiating to your jaw, shoulder, or back, sweats or fainting. Otherwise see the Cardiologist or your primary care doctor as requested. ° ° °Nonspecific Chest Pain  °Chest pain can be caused by many different conditions. There is always a chance that your pain could be related to something serious, such as a heart attack or a blood clot in your lungs. Chest pain can also be caused by conditions that are not life-threatening. If you have chest pain, it is very important to follow up with your health care provider. °CAUSES  °Chest pain can be caused by: °· Heartburn. °· Pneumonia or bronchitis. °· Anxiety or stress. °· Inflammation around your heart (pericarditis) or lung (pleuritis or pleurisy). °· A blood clot in your lung. °· A collapsed lung (pneumothorax). It can develop suddenly on its own (spontaneous pneumothorax) or from trauma to the chest. °· Shingles infection (varicella-zoster virus). °· Heart attack. °· Damage to the bones, muscles, and cartilage that make up your chest wall. This can include: °¨ Bruised bones due to injury. °¨ Strained muscles or cartilage due to frequent or repeated coughing or overwork. °¨ Fracture to one or more ribs. °¨ Sore cartilage due to inflammation (costochondritis). °RISK FACTORS  °Risk factors for chest pain may include: °· Activities that increase your risk for trauma or injury to your chest. °· Respiratory infections or conditions that cause frequent coughing. °· Medical conditions or overeating that can cause  heartburn. °· Heart disease or family history of heart disease. °· Conditions or health behaviors that increase your risk of developing a blood clot. °· Having had chicken pox (varicella zoster). °SIGNS AND SYMPTOMS °Chest pain can feel like: °· Burning or tingling on the surface of your chest or deep in your chest. °· Crushing, pressure, aching, or squeezing pain. °· Dull or sharp pain that is worse when you move, cough, or take a deep breath. °· Pain that is also felt in your back, neck, shoulder, or arm, or pain that spreads to any of these areas. °Your chest pain may come and go, or it may stay constant. °DIAGNOSIS °Lab tests or other studies may be needed to find the cause of your pain. Your health care provider may have you take a test called an ambulatory ECG (electrocardiogram). An ECG records your heartbeat patterns at the time the test is performed. You may also have other tests, such as: °· Transthoracic echocardiogram (TTE). During echocardiography, sound waves are used to create a picture of all of the heart structures and to look at how blood flows through your heart. °· Transesophageal echocardiogram (TEE). This is a more advanced imaging test that obtains images from inside your body. It allows your health care provider to see your heart in finer detail. °· Cardiac monitoring. This allows your health care provider to monitor your heart rate and rhythm in real time. °· Holter monitor. This is a portable device that records your heartbeat and can help to diagnose abnormal heartbeats. It allows your health care provider to track your   heart activity for several days, if needed. °· Stress tests. These can be done through exercise or by taking medicine that makes your heart beat more quickly. °· Blood tests. °· Imaging tests. °TREATMENT  °Your treatment depends on what is causing your chest pain. Treatment may include: °· Medicines. These may include: °¨ Acid blockers for heartburn. °¨ Anti-inflammatory  medicine. °¨ Pain medicine for inflammatory conditions. °¨ Antibiotic medicine, if an infection is present. °¨ Medicines to dissolve blood clots. °¨ Medicines to treat coronary artery disease. °· Supportive care for conditions that do not require medicines. This may include: °¨ Resting. °¨ Applying heat or cold packs to injured areas. °¨ Limiting activities until pain decreases. °HOME CARE INSTRUCTIONS °· If you were prescribed an antibiotic medicine, finish it all even if you start to feel better. °· Avoid any activities that bring on chest pain. °· Do not use any tobacco products, including cigarettes, chewing tobacco, or electronic cigarettes. If you need help quitting, ask your health care provider. °· Do not drink alcohol. °· Take medicines only as directed by your health care provider. °· Keep all follow-up visits as directed by your health care provider. This is important. This includes any further testing if your chest pain does not go away. °· If heartburn is the cause for your chest pain, you may be told to keep your head raised (elevated) while sleeping. This reduces the chance that acid will go from your stomach into your esophagus. °· Make lifestyle changes as directed by your health care provider. These may include: °¨ Getting regular exercise. Ask your health care provider to suggest some activities that are safe for you. °¨ Eating a heart-healthy diet. A registered dietitian can help you to learn healthy eating options. °¨ Maintaining a healthy weight. °¨ Managing diabetes, if necessary. °¨ Reducing stress. °SEEK MEDICAL CARE IF: °· Your chest pain does not go away after treatment. °· You have a rash with blisters on your chest. °· You have a fever. °SEEK IMMEDIATE MEDICAL CARE IF:  °· Your chest pain is worse. °· You have an increasing cough, or you cough up blood. °· You have severe abdominal pain. °· You have severe weakness. °· You faint. °· You have chills. °· You have sudden, unexplained chest  discomfort. °· You have sudden, unexplained discomfort in your arms, back, neck, or jaw. °· You have shortness of breath at any time. °· You suddenly start to sweat, or your skin gets clammy. °· You feel nauseous or you vomit. °· You suddenly feel light-headed or dizzy. °· Your heart begins to beat quickly, or it feels like it is skipping beats. °These symptoms may represent a serious problem that is an emergency. Do not wait to see if the symptoms will go away. Get medical help right away. Call your local emergency services (911 in the [place] Do not drive yourself to the hospital. °  °This information is not intended to replace advice given to you by your health care provider. Make sure you discuss any questions you have with your health care provider. °  °Document Released: 12/17/2004 Document Revised: 03/30/2014 Document Reviewed: 10/13/2013 °Elsevier Interactive Patient Education ©2016 [employer] ° °

## 2014-12-31 NOTE — ED Notes (Signed)
Patient transported to X-ray 

## 2015-01-25 ENCOUNTER — Ambulatory Visit (INDEPENDENT_AMBULATORY_CARE_PROVIDER_SITE_OTHER): Payer: BLUE CROSS/BLUE SHIELD | Admitting: Internal Medicine

## 2015-01-25 ENCOUNTER — Encounter: Payer: Self-pay | Admitting: Internal Medicine

## 2015-01-25 VITALS — BP 124/62 | HR 54 | Temp 98.3°F | Wt 194.5 lb

## 2015-01-25 DIAGNOSIS — J0110 Acute frontal sinusitis, unspecified: Secondary | ICD-10-CM | POA: Insufficient documentation

## 2015-01-25 MED ORDER — AMOXICILLIN 500 MG PO TABS: 1000.0000 mg | ORAL_TABLET | Freq: Two times a day (BID) | ORAL | Status: DC

## 2015-01-25 NOTE — Progress Notes (Signed)
   Subjective:    Patient ID: Jillian Contreras, female    DOB: 1985/09/15, 29 y.o.   MRN: [id number]  HPI Here due to ear pain  Has pain and pressure in both ears Just started yesterday Right more than left Headache--frontal pain and occipital pain  Had bad cough and lost voice--over past 2 weeks Low grade fever x 2 days No SOB Slight sore throat Some nasal drainage--post nasal drip  Tried tylenol cold ---no help  Current Outpatient Prescriptions on File Prior to Visit  Medication Sig Dispense Refill  . diclofenac (VOLTAREN) 75 MG EC tablet Take 1 tablet (75 mg total) by mouth 2 (two) times daily. 30 tablet 0  . LORazepam (ATIVAN) 0.5 MG tablet Take 1 tablet (0.5 mg total) by mouth every 8 (eight) hours. 30 tablet 0  . Multiple Vitamin (MULTIVITAMIN) tablet Take 1 tablet by mouth daily.     No current facility-administered medications on file prior to visit.    No Known Allergies  Past Medical History  Diagnosis Date  . Palpitations   . Palpitations   . Hypokalemia   . Chest pain   . Anxiety     Past Surgical History  Procedure Laterality Date  . Cesarean section      Family History  Problem Relation Age of Onset  . Cancer Paternal Aunt 2045    breast  . Breast cancer Paternal Aunt   . Breast cancer Paternal Grandmother   . Breast cancer Paternal Aunt   . Breast cancer Paternal Aunt     Social History   Social History  . Marital Status: Single    Spouse Name: N/A  . Number of Children: N/A  . Years of Education: N/A   Occupational History  . customer service     [employer]   Social History Main Topics  . Smoking status: Never Smoker   . Smokeless tobacco: Never Used  . Alcohol Use: No  . Drug Use: No  . Sexual Activity: Not on file   Other Topics Concern  . Not on file   Social History Narrative   Review of Systems No rash Some loose stools--- no vomiting Has some intermittent acid reflux Appetite is oaky    Objective:   Physical Exam  Constitutional: She appears well-developed and well-nourished. No distress.  HENT:  Mouth/Throat: Oropharynx is clear and moist. No oropharyngeal exudate.  No sinus tenderness TMs look normal--no clear fluid Marked nasal swelling  Neck: Normal range of motion. Neck supple. No thyromegaly present.  Pulmonary/Chest: Effort normal and breath sounds normal. No respiratory distress. She has no wheezes. She has no rales.  Lymphadenopathy:    She has no cervical adenopathy.          Assessment & Plan:

## 2015-01-25 NOTE — Progress Notes (Signed)
Pre visit review using our clinic review tool, if applicable. No additional management support is needed unless otherwise documented below in the visit note. 

## 2015-01-25 NOTE — Patient Instructions (Signed)
Please start the antibiotic if you are getting worse in the next couple of days---or are not better by the middle of next week.

## 2015-01-25 NOTE — Assessment & Plan Note (Signed)
Sick for 2 weeks with sounds like viral infection Now worse with ear pain (though no findings) Discussed supportive Rx If worsens, start the amoxil

## 2015-02-07 NOTE — ED Provider Notes (Signed)
CSN: [id number]     Arrival date & time 12/31/14  4098 History   First MD Initiated Contact with Patient 12/31/14 [phone]     Chief Complaint  Patient presents with  . Chest Pain     (Consider location/radiation/quality/duration/timing/severity/associated sxs/prior Treatment) HPI Comments: Pt comes in with cc of chest pain. Chest pain is L sided. Pt has no cardiac hx, no family hx of premature CAD. No substance abuse hx. Pt is noted to be bradycardic, no dizziness Chest pain x 1 week, associated dib. No exertional component, no specific aggravating/relieving factors.   Patient is a 29 y.o. female presenting with chest pain. The history is provided by the patient.  Chest Pain Associated symptoms: shortness of breath   Associated symptoms: no abdominal pain, no headache, no nausea and not vomiting     Past Medical History  Diagnosis Date  . Palpitations   . Palpitations   . Hypokalemia   . Chest pain   . Anxiety    Past Surgical History  Procedure Laterality Date  . Cesarean section     Family History  Problem Relation Age of Onset  . Cancer Paternal Aunt 24    breast  . Breast cancer Paternal Aunt   . Breast cancer Paternal Grandmother   . Breast cancer Paternal Aunt   . Breast cancer Paternal Aunt    Social History  Substance Use Topics  . Smoking status: Never Smoker   . Smokeless tobacco: Never Used  . Alcohol Use: No   OB History    No data available     Review of Systems  Constitutional: Positive for activity change.  Respiratory: Positive for shortness of breath.   Cardiovascular: Positive for chest pain.  Gastrointestinal: Negative for nausea, vomiting and abdominal pain.  Genitourinary: Negative for dysuria.  Musculoskeletal: Negative for neck pain.  Neurological: Negative for headaches.      Allergies  Review of patient's allergies indicates no known allergies.  Home Medications   Prior to Admission medications   Medication Sig Start Date End  Date Taking? Authorizing Provider  diclofenac (VOLTAREN) 75 MG EC tablet Take 1 tablet (75 mg total) by mouth 2 (two) times daily. 12/28/14  Yes Amy Michelle Nasuti, MD  LORazepam (ATIVAN) 0.5 MG tablet Take 1 tablet (0.5 mg total) by mouth every 8 (eight) hours. 12/25/14  Yes Dianne Dun, MD  Multiple Vitamin (MULTIVITAMIN) tablet Take 1 tablet by mouth daily.   Yes Historical Provider, MD  amoxicillin (AMOXIL) 500 MG tablet Take 2 tablets (1,000 mg total) by mouth 2 (two) times daily. 01/25/15   Karie Schwalbe, MD   BP 99/48 mmHg  Pulse 45  Temp(Src) 98 F (36.7 C) (Oral)  Resp 13  SpO2 98%  LMP 12/17/2014 (Approximate) Physical Exam  Constitutional: She is oriented to person, place, and time. She appears well-developed and well-nourished.  HENT:  Head: Normocephalic and atraumatic.  Eyes: EOM are normal. Pupils are equal, round, and reactive to light.  Neck: Neck supple.  Cardiovascular: Normal rate, regular rhythm and normal heart sounds.   No murmur heard. Pulmonary/Chest: Effort normal. No respiratory distress.  Abdominal: Soft. She exhibits no distension. There is no tenderness. There is no rebound and no guarding.  Neurological: She is alert and oriented to person, place, and time.  Skin: Skin is warm and dry.  Nursing note and vitals reviewed.   ED Course  Procedures (including critical care time) Labs Review Labs Reviewed  BASIC METABOLIC PANEL - Abnormal;  Notable for the following:    Glucose, Bld 111 (*)    All other components within normal limits  CBC - Abnormal; Notable for the following:    Hemoglobin 10.8 (*)    HCT 33.7 (*)    MCH 25.7 (*)    All other components within normal limits  I-STAT TROPOININ, ED  I-STAT TROPOININ, ED    Imaging Review No results found. I have personally reviewed and evaluated these images and lab results as part of my medical decision-making.   EKG Interpretation   Date/Time:  Monday December 31 2014 05:06:04 EDT Ventricular  Rate:  51 PR Interval:  160 QRS Duration: 80 QT Interval:  440 QTC Calculation: 405 R Axis:   49 Text Interpretation:  Sinus bradycardia Otherwise normal ECG No acute  changes Confirmed by Belle Charlie, MD, Maurizio Geno ([id number]) on 12/31/2014 5:17:06 AM      MDM   Final diagnoses:  Precordial pain    Pt with atypical chest pain. HEAR score is 1 - for the hx. Bradycardia is not symptomatic. Trops x 2.    Derwood KaplanAnkit Shiah Berhow, MD 02/07/15 [phone]

## 2015-02-22 ENCOUNTER — Ambulatory Visit: Payer: BLUE CROSS/BLUE SHIELD | Admitting: Internal Medicine

## 2015-02-23 ENCOUNTER — Emergency Department (HOSPITAL_COMMUNITY): Payer: BLUE CROSS/BLUE SHIELD

## 2015-02-23 ENCOUNTER — Emergency Department (HOSPITAL_COMMUNITY)
Admission: EM | Admit: 2015-02-23 | Discharge: 2015-02-23 | Disposition: A | Discharge status: Home / Self Care | Payer: BLUE CROSS/BLUE SHIELD | Attending: Emergency Medicine | Admitting: Emergency Medicine

## 2015-02-23 ENCOUNTER — Encounter (HOSPITAL_COMMUNITY): Payer: Self-pay | Admitting: Emergency Medicine

## 2015-02-23 DIAGNOSIS — Z791 Long term (current) use of non-steroidal anti-inflammatories (NSAID): Secondary | ICD-10-CM | POA: Insufficient documentation

## 2015-02-23 DIAGNOSIS — K5900 Constipation, unspecified: Secondary | ICD-10-CM | POA: Insufficient documentation

## 2015-02-23 DIAGNOSIS — F419 Anxiety disorder, unspecified: Secondary | ICD-10-CM | POA: Diagnosis not present

## 2015-02-23 DIAGNOSIS — R05 Cough: Secondary | ICD-10-CM | POA: Insufficient documentation

## 2015-02-23 DIAGNOSIS — R197 Diarrhea, unspecified: Secondary | ICD-10-CM | POA: Insufficient documentation

## 2015-02-23 DIAGNOSIS — Z79899 Other long term (current) drug therapy: Secondary | ICD-10-CM | POA: Diagnosis not present

## 2015-02-23 DIAGNOSIS — R51 Headache: Secondary | ICD-10-CM | POA: Insufficient documentation

## 2015-02-23 DIAGNOSIS — M791 Myalgia, unspecified site: Secondary | ICD-10-CM

## 2015-02-23 DIAGNOSIS — Z8639 Personal history of other endocrine, nutritional and metabolic disease: Secondary | ICD-10-CM | POA: Insufficient documentation

## 2015-02-23 DIAGNOSIS — R079 Chest pain, unspecified: Secondary | ICD-10-CM | POA: Diagnosis present

## 2015-02-23 DIAGNOSIS — R110 Nausea: Secondary | ICD-10-CM | POA: Diagnosis not present

## 2015-02-23 DIAGNOSIS — Z3202 Encounter for pregnancy test, result negative: Secondary | ICD-10-CM | POA: Diagnosis not present

## 2015-02-23 HISTORY — DX: Panic disorder (episodic paroxysmal anxiety): F41.0

## 2015-02-23 LAB — CBC
HCT: 34.5 % — ABNORMAL LOW (ref 36.0–46.0)
HEMOGLOBIN: 11 g/dL — AB (ref 12.0–15.0)
MCH: 26.3 pg (ref 26.0–34.0)
MCHC: 31.9 g/dL (ref 30.0–36.0)
MCV: 82.5 fL (ref 78.0–100.0)
Platelets: 326 10*3/uL (ref 150–400)
RBC: 4.18 MIL/uL (ref 3.87–5.11)
RDW: 14.5 % (ref 11.5–15.5)
WBC: 7.1 10*3/uL (ref 4.0–10.5)

## 2015-02-23 LAB — I-STAT TROPONIN, ED: TROPONIN I, POC: 0 ng/mL (ref 0.00–0.08)

## 2015-02-23 LAB — URINALYSIS, ROUTINE W REFLEX MICROSCOPIC
Bilirubin Urine: NEGATIVE
Glucose, UA: NEGATIVE mg/dL
Hgb urine dipstick: NEGATIVE
KETONES UR: NEGATIVE mg/dL
LEUKOCYTES UA: NEGATIVE
NITRITE: NEGATIVE
PROTEIN: NEGATIVE mg/dL
Specific Gravity, Urine: 1.011 (ref 1.005–1.030)
pH: 7.5 (ref 5.0–8.0)

## 2015-02-23 LAB — SEDIMENTATION RATE: Sed Rate: 18 mm/hr (ref 0–22)

## 2015-02-23 LAB — BASIC METABOLIC PANEL
ANION GAP: 5 (ref 5–15)
BUN: 6 mg/dL (ref 6–20)
CALCIUM: 9.4 mg/dL (ref 8.9–10.3)
CHLORIDE: 106 mmol/L (ref 101–111)
CO2: 28 mmol/L (ref 22–32)
Creatinine, Ser: 0.77 mg/dL (ref 0.44–1.00)
GFR calc non Af Amer: >60 mL/min (ref >60)
Glucose, Bld: 101 mg/dL — ABNORMAL HIGH (ref 65–99)
Potassium: 3.8 mmol/L (ref 3.5–5.1)
Sodium: 139 mmol/L (ref 135–145)

## 2015-02-23 LAB — HEPATIC FUNCTION PANEL
ALBUMIN: 3.9 g/dL (ref 3.5–5.0)
ALT: 29 U/L (ref 14–54)
AST: 23 U/L (ref 15–41)
Alkaline Phosphatase: 76 U/L (ref 38–126)
BILIRUBIN TOTAL: 0.2 mg/dL — AB (ref 0.3–1.2)
Bilirubin, Direct: <0.1 mg/dL — ABNORMAL LOW (ref 0.1–0.5)
TOTAL PROTEIN: 7.3 g/dL (ref 6.5–8.1)

## 2015-02-23 LAB — I-STAT BETA HCG BLOOD, ED (MC, WL, AP ONLY)

## 2015-02-23 MED ORDER — TRAMADOL HCL 50 MG PO TABS: 50.0000 mg | ORAL_TABLET | Freq: Four times a day (QID) | ORAL | Status: DC | PRN

## 2015-02-23 NOTE — ED Notes (Signed)
Pt. reports intermittent central / upper chest pain with SOB , productive cough and nausea onset this week , denies diaphoresis .

## 2015-02-23 NOTE — Discharge Instructions (Signed)

## 2015-02-23 NOTE — ED Provider Notes (Signed)
CSN: [id number]     Arrival date & time 02/23/15  1934 History   First MD Initiated Contact with Patient 02/23/15 1949     Chief Complaint  Patient presents with  . Chest Pain     (Consider location/radiation/quality/duration/timing/severity/associated sxs/prior Treatment) HPI  Jillian Contreras is a 29 y.o. female who presents for evaluation of generalized achiness for one month, with chest pain for several days, all described as an achy sensation. She additionally has pain in her face, that feels like "nerve pain in my jaw", intermittent periods of constipation and diarrhea, cough which is occasionally productive of sputum, and nausea without vomiting. She denies stress. She was treated by her PCP with diclofenac, without relief of the discomfort. She does not take oral contraceptives. No prior similar problems. There are no other no modifying factors.    Past Medical History  Diagnosis Date  . Palpitations   . Palpitations   . Hypokalemia   . Chest pain   . Anxiety   . Panic attack    Past Surgical History  Procedure Laterality Date  . Cesarean section     Family History  Problem Relation Age of Onset  . Cancer Paternal Aunt 80    breast  . Breast cancer Paternal Aunt   . Breast cancer Paternal Grandmother   . Breast cancer Paternal Aunt   . Breast cancer Paternal Aunt    Social History  Substance Use Topics  . Smoking status: Never Smoker   . Smokeless tobacco: Never Used  . Alcohol Use: No   OB History    No data available     Review of Systems  All other systems reviewed and are negative.     Allergies  Review of patient's allergies indicates no known allergies.  Home Medications   Prior to Admission medications   Medication Sig Start Date End Date Taking? Authorizing Provider  diclofenac (VOLTAREN) 75 MG EC tablet Take 1 tablet (75 mg total) by mouth 2 (two) times daily. 12/28/14  Yes Amy Michelle Nasuti, MD  ferrous sulfate 325 (65 FE) MG tablet Take 325  mg by mouth daily with breakfast.   Yes Historical Provider, MD  naproxen (NAPROSYN) 250 MG tablet Take 250 mg by mouth 2 (two) times daily with a meal.   Yes Historical Provider, MD  vitamin B-12 (CYANOCOBALAMIN) 1000 MCG tablet Take 1,000 mcg by mouth daily.   Yes Historical Provider, MD   BP 113/67 mmHg  Pulse 61  Temp(Src) 98.1 F (36.7 C) (Oral)  Resp 20  Ht  (1.651 m)  Wt 194 lb 6 oz (88.168 kg)  BMI 32.35 kg/m2  SpO2 100%  LMP 02/18/2015 Physical Exam  Constitutional: She is oriented to person, place, and time. She appears well-developed and well-nourished. She appears distressed (she is anxious).  HENT:  Head: Normocephalic and atraumatic.  Right Ear: External ear normal.  Left Ear: External ear normal.  Eyes: Conjunctivae and EOM are normal. Pupils are equal, round, and reactive to light.  Neck: Normal range of motion and phonation normal. Neck supple.  Cardiovascular: Normal rate, regular rhythm and normal heart sounds.   Pulmonary/Chest: Effort normal and breath sounds normal. She exhibits no bony tenderness.  Abdominal: Soft. There is no tenderness.  Musculoskeletal: Normal range of motion.  Normal range of motion, arms, legs, bilaterally. No deformity of the large joints.  Neurological: She is alert and oriented to person, place, and time. No cranial nerve deficit or sensory deficit. She exhibits  normal muscle tone. Coordination normal.  Skin: Skin is warm, dry and intact.  Psychiatric: Her behavior is normal. Judgment and thought content normal.  She appears worried  Nursing note and vitals reviewed.   ED Course  Procedures (including critical care time)  Medications - No data to display  Patient Vitals for the past 24 hrs:  BP Temp Temp src Pulse Resp SpO2 Height Weight  02/23/15 2100 113/67 mmHg - - - - - - -  02/23/15 2000 125/73 mmHg - - 61 20 100 % - -  02/23/15 1939 126/83 mmHg 98.1 F (36.7 C) Oral (!) 59 18 99 % 5\' 5"  (1.651 m) 194 lb 6 oz  (88.168 kg)    9:48 PM Reevaluation with update and discussion. After initial assessment and treatment, an updated evaluation reveals she remains comfortable. No additional complaints. There are no other known modifying factors. Yazmen Briones L    Labs Review Labs Reviewed  BASIC METABOLIC PANEL - Abnormal; Notable for the following:    Glucose, Bld 101 (*)    All other components within normal limits  CBC - Abnormal; Notable for the following:    Hemoglobin 11.0 (*)    HCT 34.5 (*)    All other components within normal limits  HEPATIC FUNCTION PANEL - Abnormal; Notable for the following:    Total Bilirubin 0.2 (*)    Bilirubin, Direct <0.1 (*)    All other components within normal limits  URINALYSIS, ROUTINE W REFLEX MICROSCOPIC (NOT AT [facility])  SEDIMENTATION RATE  I-STAT TROPOININ, ED  I-STAT BETA HCG BLOOD, ED ([facility], [facility], [facility] ONLY)    Imaging Review Dg Chest 2 View  02/23/2015  CLINICAL DATA:  29 year old female with chest pain and shortness of breath EXAM: CHEST  2 VIEW COMPARISON:  Chest radiograph dated 12/31/2014 FINDINGS: The heart size and mediastinal contours are within normal limits. Both lungs are clear. The visualized skeletal structures are unremarkable. IMPRESSION: No active cardiopulmonary disease. Electronically Signed   By: Elgie CollardArash  Radparvar M.D.   On: 02/23/2015 20:13   I have personally reviewed and evaluated these images and lab results as part of my medical decision-making.   EKG Interpretation   Date/Time:  Saturday February 23 2015 19:43:05 EST Ventricular Rate:  64 PR Interval:  160 QRS Duration: 84 QT Interval:  408 QTC Calculation: 420 R Axis:   71 Text Interpretation:  Normal sinus rhythm with sinus arrhythmia Normal ECG  Since last tracing rate faster Confirmed by Danila Eddie  MD, Taeshawn Helfman ([id number]) on  02/23/2015 7:48:19 PM      MDM   Final diagnoses:  Myalgia  Nonspecific chest pain    Nonspecific myalgias and chest pain. Doubt ACS, PE, pneumonia,  or metabolic instability. No evidence for acute arthritis.  Nursing Notes Reviewed/ Care Coordinated Applicable Imaging Reviewed Interpretation of Laboratory Data incorporated into ED treatment  The patient appears reasonably screened and/or stabilized for discharge and I doubt any other medical condition or other [facility] requiring further screening, evaluation, or treatment in the ED at this time prior to discharge.  Plan: Home Medications- Tylenol, Tramadol; Home Treatments- rest; return here if the recommended treatment, does not improve the symptoms; Recommended follow up- PCP prn     Mancel BaleElliott Jennel Mara, MD 02/23/15 2150

## 2015-04-05 ENCOUNTER — Ambulatory Visit (INDEPENDENT_AMBULATORY_CARE_PROVIDER_SITE_OTHER): Payer: BLUE CROSS/BLUE SHIELD | Admitting: Primary Care

## 2015-04-05 ENCOUNTER — Encounter: Payer: Self-pay | Admitting: Primary Care

## 2015-04-05 ENCOUNTER — Telehealth: Payer: Self-pay | Admitting: Primary Care

## 2015-04-05 VITALS — BP 120/78 | HR 56 | Temp 98.0°F | Ht 65.0 in | Wt 198.1 lb

## 2015-04-05 DIAGNOSIS — R591 Generalized enlarged lymph nodes: Secondary | ICD-10-CM

## 2015-04-05 LAB — COMPREHENSIVE METABOLIC PANEL
ALBUMIN: 4.1 g/dL (ref 3.5–5.2)
ALK PHOS: 67 U/L (ref 39–117)
ALT: 16 U/L (ref 0–35)
AST: 17 U/L (ref 0–37)
BILIRUBIN TOTAL: 0.3 mg/dL (ref 0.2–1.2)
BUN: 9 mg/dL (ref 6–23)
CHLORIDE: 106 meq/L (ref 96–112)
CO2: 27 mEq/L (ref 19–32)
Calcium: 9.4 mg/dL (ref 8.4–10.5)
Creatinine, Ser: 0.7 mg/dL (ref 0.40–1.20)
GFR: 125.56 mL/min (ref >60.00)
Glucose, Bld: 101 mg/dL — ABNORMAL HIGH (ref 70–99)
POTASSIUM: 4.3 meq/L (ref 3.5–5.1)
SODIUM: 138 meq/L (ref 135–145)
TOTAL PROTEIN: 7.4 g/dL (ref 6.0–8.3)

## 2015-04-05 LAB — CBC WITH DIFFERENTIAL/PLATELET
BASOS PCT: 0.4 % (ref 0.0–3.0)
Basophils Absolute: 0 10*3/uL (ref 0.0–0.1)
EOS PCT: 1.4 % (ref 0.0–5.0)
Eosinophils Absolute: 0.1 10*3/uL (ref 0.0–0.7)
HCT: 36 % (ref 36.0–46.0)
Hemoglobin: 11.6 g/dL — ABNORMAL LOW (ref 12.0–15.0)
LYMPHS ABS: 2 10*3/uL (ref 0.7–4.0)
Lymphocytes Relative: 32.3 % (ref 12.0–46.0)
MCHC: 32.2 g/dL (ref 30.0–36.0)
MCV: 81.7 fl (ref 78.0–100.0)
MONO ABS: 0.3 10*3/uL (ref 0.1–1.0)
MONOS PCT: 5.7 % (ref 3.0–12.0)
NEUTROS PCT: 60.2 % (ref 43.0–77.0)
Neutro Abs: 3.6 10*3/uL (ref 1.4–7.7)
Platelets: 289 10*3/uL (ref 150.0–400.0)
RBC: 4.41 Mil/uL (ref 3.87–5.11)
RDW: 15.4 % (ref 11.5–15.5)
WBC: 6.1 10*3/uL (ref 4.0–10.5)

## 2015-04-05 NOTE — Patient Instructions (Addendum)
Your symptoms are likely related to allergies.  Nasal Congestion: Try using Flonase (fluticasone) nasal spray. Instill 2 sprays in each nostril once daily.   Sinus pressure, stuffy nose, drainage: Zyrtec at bedtime. Off brand Zyrtec is fine to take.  Complete lab work prior to leaving today. I will notify you of your results once received.   Please notify PCP if symptoms do not resolve in 1 month, or sooner if symptoms progress.   It was a pleasure to see you today!

## 2015-04-05 NOTE — Telephone Encounter (Signed)
Patient calling to find out the results of her lab work.

## 2015-04-05 NOTE — Progress Notes (Signed)
Pre visit review using our clinic review tool, if applicable. No additional management support is needed unless otherwise documented below in the visit note. 

## 2015-04-05 NOTE — Progress Notes (Signed)
Subjective:    Patient ID: Jillian Contreras, female    DOB: [birth year], 30 y.o.   MRN: [id number]  HPI  Jillian Contreras is a 30 year old female who presents today with a chief complaint of swelling to lymph nodes. Her swelling is located to her mandibular region and neck. She also reports body aches, bilateral ear pain/pressure, vibration sensation to ears. Her lymph node swelling has been present since November. She was treated in the beginning of November for an acute viral sinusitis, she was provided with amoxicillin if needed for symptoms for which she never took.   She reports chills, mild cough, sore throat recently. She's been around her co-workers who have been ill. Her recent symptoms have been present since last weekend. Denies fevers, chills.  Review of Systems  Constitutional: Positive for chills. Negative for fever.  HENT: Positive for congestion, ear pain, sinus pressure and sore throat.   Respiratory: Positive for cough. Negative for shortness of breath.   Cardiovascular: Negative for chest pain.  Gastrointestinal: Negative for nausea.  Neurological: Positive for headaches.       Past Medical History  Diagnosis Date  . Palpitations   . Palpitations   . Hypokalemia   . Chest pain   . Anxiety   . Panic attack     Social History   Social History  . Marital Status: Single    Spouse Name: N/A  . Number of Children: N/A  . Years of Education: N/A   Occupational History  . customer service     [employer]   Social History Main Topics  . Smoking status: Never Smoker   . Smokeless tobacco: Never Used  . Alcohol Use: No  . Drug Use: No  . Sexual Activity: Not on file   Other Topics Concern  . Not on file   Social History Narrative    Past Surgical History  Procedure Laterality Date  . Cesarean section      Family History  Problem Relation Age of Onset  . Cancer Paternal Aunt 3845    breast  . Breast cancer Paternal Aunt   . Breast cancer Paternal  Grandmother   . Breast cancer Paternal Aunt   . Breast cancer Paternal Aunt     No Known Allergies  Current Outpatient Prescriptions on File Prior to Visit  Medication Sig Dispense Refill  . ferrous sulfate 325 (65 FE) MG tablet Take 325 mg by mouth daily with breakfast.    . vitamin B-12 (CYANOCOBALAMIN) 1000 MCG tablet Take 1,000 mcg by mouth daily.    . diclofenac (VOLTAREN) 75 MG EC tablet Take 1 tablet (75 mg total) by mouth 2 (two) times daily. (Patient not taking: Reported on 04/05/2015) 30 tablet 0  . naproxen (NAPROSYN) 250 MG tablet Take 250 mg by mouth 2 (two) times daily with a meal. Reported on 04/05/2015    . traMADol (ULTRAM) 50 MG tablet Take 1 tablet (50 mg total) by mouth every 6 (six) hours as needed for moderate pain. (Patient not taking: Reported on 04/05/2015) 15 tablet 0   No current facility-administered medications on file prior to visit.    BP 120/78 mmHg  Pulse 56  Temp(Src) 98 F (36.7 C) (Oral)  Ht 5\' 5"  (1.651 m)  Wt 198 lb 1.9 oz (89.867 kg)  BMI 32.97 kg/m2  SpO2 97%  LMP 03/10/2015    Objective:   Physical Exam  Constitutional: She appears well-nourished.  HENT:  Right Ear: Tympanic membrane and  ear canal normal.  Left Ear: Tympanic membrane and ear canal normal.  Nose: Right sinus exhibits maxillary sinus tenderness. Right sinus exhibits no frontal sinus tenderness. Left sinus exhibits maxillary sinus tenderness. Left sinus exhibits no frontal sinus tenderness.  Mouth/Throat: Oropharynx is clear and moist.  Eyes: Conjunctivae are normal.  Neck:  Mildly enlarged lymph node to right submandibular region. Non tender, soft, mobile. No other cervical adenopathy noted.   Cardiovascular: Normal rate and regular rhythm.   Pulmonary/Chest: Effort normal and breath sounds normal. She has no wheezes. She has no rales.          Assessment & Plan:  Viral vs. Allergic Sinusitis:  Mild swelling to right submandibular lymph node, non tender, mobile,  soft. Suspect due to allergies or viral involvement. Exam with clear airways, mild tenderness to maxillary sinuses. Do not suspect bacterial process and will treat with supportive measures. Reassurance provided. Start Flonase, Zyrtec HS. Follow up if no improvement. CBC and CMP completed to rule out other abnormalities. Labs returned and are normal.

## 2015-05-08 ENCOUNTER — Encounter: Payer: Self-pay | Admitting: Family Medicine

## 2015-05-08 ENCOUNTER — Ambulatory Visit (INDEPENDENT_AMBULATORY_CARE_PROVIDER_SITE_OTHER): Payer: BLUE CROSS/BLUE SHIELD | Admitting: Family Medicine

## 2015-05-08 VITALS — BP 108/68 | HR 56 | Temp 98.0°F | Wt 200.5 lb

## 2015-05-08 DIAGNOSIS — K219 Gastro-esophageal reflux disease without esophagitis: Secondary | ICD-10-CM | POA: Diagnosis not present

## 2015-05-08 DIAGNOSIS — M2550 Pain in unspecified joint: Secondary | ICD-10-CM | POA: Diagnosis not present

## 2015-05-08 LAB — COMPREHENSIVE METABOLIC PANEL
ALBUMIN: 4.2 g/dL (ref 3.5–5.2)
ALK PHOS: 69 U/L (ref 39–117)
ALT: 20 U/L (ref 0–35)
AST: 18 U/L (ref 0–37)
BUN: 12 mg/dL (ref 6–23)
CALCIUM: 9.3 mg/dL (ref 8.4–10.5)
CHLORIDE: 105 meq/L (ref 96–112)
CO2: 26 mEq/L (ref 19–32)
Creatinine, Ser: 0.65 mg/dL (ref 0.40–1.20)
GFR: 136.69 mL/min (ref >60.00)
Glucose, Bld: 84 mg/dL (ref 70–99)
POTASSIUM: 3.8 meq/L (ref 3.5–5.1)
Sodium: 138 mEq/L (ref 135–145)
Total Bilirubin: 0.2 mg/dL (ref 0.2–1.2)
Total Protein: 7.4 g/dL (ref 6.0–8.3)

## 2015-05-08 LAB — H. PYLORI ANTIBODY, IGG: H PYLORI IGG: NEGATIVE

## 2015-05-08 LAB — TSH: TSH: 1.28 u[IU]/mL (ref 0.35–4.50)

## 2015-05-08 LAB — SEDIMENTATION RATE: Sed Rate: 17 mm/hr (ref 0–22)

## 2015-05-08 NOTE — Assessment & Plan Note (Signed)
>  25 minutes spent in face to face time with patient, >50% spent in counselling or coordination of care discussing arthralgias and GERD. Initial work up- labs today including RF, H Pylori. The patient indicates understanding of these issues and agrees with the plan. Orders Placed This Encounter  Procedures  . Rheumatoid Factor  . Cyclic Citrul Peptide Antibody, IGG  . Sedimentation Rate  . ANA  . Comprehensive metabolic panel  . H. pylori antibody, IgG  . TSH

## 2015-05-08 NOTE — Progress Notes (Signed)
Subjective:   Patient ID: Jillian Contreras, female    DOB: 04/21/1985, 30 y.o.   MRN: [id number]  Jillian Contreras is a pleasant 30 y.o. year old female who presents to clinic today with Joint Pain  on 05/08/2015  HPI:  Several months of progressive joint pain- bilateral knees, hands, shoulder, feet.  Feels it is worse in the morning.  Joints feels swollen but she is not sure if they are.  Never red or warm.  Mobic helps a little bit but not much.  Grandfather had RA.  Feels her GERD is worse as well.  Ranitidine not as effective.  +fatigue.  Current Outpatient Prescriptions on File Prior to Visit  Medication Sig Dispense Refill  . ferrous sulfate 325 (65 FE) MG tablet Take 325 mg by mouth daily with breakfast.    . vitamin B-12 (CYANOCOBALAMIN) 1000 MCG tablet Take 1,000 mcg by mouth daily.    . diclofenac (VOLTAREN) 75 MG EC tablet Take 1 tablet (75 mg total) by mouth 2 (two) times daily. (Patient not taking: Reported on 04/05/2015) 30 tablet 0  . naproxen (NAPROSYN) 250 MG tablet Take 250 mg by mouth 2 (two) times daily with a meal. Reported on 05/08/2015    . traMADol (ULTRAM) 50 MG tablet Take 1 tablet (50 mg total) by mouth every 6 (six) hours as needed for moderate pain. (Patient not taking: Reported on 04/05/2015) 15 tablet 0   No current facility-administered medications on file prior to visit.    No Known Allergies  Past Medical History  Diagnosis Date  . Palpitations   . Palpitations   . Hypokalemia   . Chest pain   . Anxiety   . Panic attack     Past Surgical History  Procedure Laterality Date  . Cesarean section      Family History  Problem Relation Age of Onset  . Cancer Paternal Aunt 39    breast  . Breast cancer Paternal Aunt   . Breast cancer Paternal Grandmother   . Breast cancer Paternal Aunt   . Breast cancer Paternal Aunt     Social History   Social History  . Marital Status: Single    Spouse Name: N/A  . Number of Children: N/A  . Years  of Education: N/A   Occupational History  . customer service     [employer]   Social History Main Topics  . Smoking status: Never Smoker   . Smokeless tobacco: Never Used  . Alcohol Use: No  . Drug Use: No  . Sexual Activity: Not on file   Other Topics Concern  . Not on file   Social History Narrative   The PMH, PSH, Social History, Family History, Medications, and allergies have been reviewed in [facility], and have been updated if relevant.   Review of Systems  Constitutional: Positive for fatigue.  Gastrointestinal: Positive for abdominal distention. Negative for nausea, vomiting, diarrhea and rectal pain.  Endocrine: Negative.   Genitourinary: Negative.   Musculoskeletal: Positive for arthralgias.  Skin: Negative.   Neurological: Negative.   Hematological: Negative.   Psychiatric/Behavioral: Negative.   All other systems reviewed and are negative.      Objective:    BP 108/68 mmHg  Pulse 56  Temp(Src) 98 F (36.7 C) (Oral)  Wt 200 lb 8 oz (90.946 kg)  LMP 05/03/2015   Physical Exam  Constitutional: She is oriented to person, place, and time. She appears well-developed and well-nourished. No distress.  HENT:  Head: Normocephalic and atraumatic.  Eyes: Conjunctivae are normal.  Neck: Normal range of motion.  Cardiovascular: Normal rate.   Pulmonary/Chest: Effort normal.  Musculoskeletal: Normal range of motion. She exhibits no edema.  Neurological: She is alert and oriented to person, place, and time. No cranial nerve deficit.  Skin: Skin is warm and dry. She is not diaphoretic.  Psychiatric: She has a normal mood and affect. Her behavior is normal. Judgment and thought content normal.          Assessment & Plan:   Arthralgia - Plan: Rheumatoid Factor, Cyclic Citrul Peptide Antibody, IGG, Sedimentation Rate, ANA, Comprehensive metabolic panel, TSH  Gastroesophageal reflux disease, esophagitis presence not specified - Plan: H. pylori antibody,  IgG, TSH No Follow-up on file.

## 2015-05-08 NOTE — Progress Notes (Signed)
Pre visit review using our clinic review tool, if applicable. No additional management support is needed unless otherwise documented below in the visit note. 

## 2015-05-09 LAB — ANA: Anti Nuclear Antibody(ANA): NEGATIVE

## 2015-05-09 LAB — CYCLIC CITRUL PEPTIDE ANTIBODY, IGG: Cyclic Citrullin Peptide Ab: <16 Units

## 2015-05-09 LAB — RHEUMATOID FACTOR: Rhuematoid fact SerPl-aCnc: <10 IU/mL (ref <=14)

## 2015-05-11 ENCOUNTER — Encounter: Payer: Self-pay | Admitting: Family Medicine

## 2015-05-15 ENCOUNTER — Ambulatory Visit: Payer: BLUE CROSS/BLUE SHIELD | Admitting: Family Medicine

## 2015-07-17 ENCOUNTER — Ambulatory Visit (INDEPENDENT_AMBULATORY_CARE_PROVIDER_SITE_OTHER): Payer: BLUE CROSS/BLUE SHIELD | Admitting: Family Medicine

## 2015-07-17 ENCOUNTER — Encounter: Payer: Self-pay | Admitting: Family Medicine

## 2015-07-17 VITALS — BP 110/80 | HR 81 | Temp 98.0°F | Wt 203.0 lb

## 2015-07-17 DIAGNOSIS — Z202 Contact with and (suspected) exposure to infections with a predominantly sexual mode of transmission: Secondary | ICD-10-CM | POA: Insufficient documentation

## 2015-07-17 DIAGNOSIS — Z113 Encounter for screening for infections with a predominantly sexual mode of transmission: Secondary | ICD-10-CM | POA: Insufficient documentation

## 2015-07-17 NOTE — Assessment & Plan Note (Signed)
Orders Placed This Encounter  Procedures  . GC/Chlamydia Probe Amp  . HIV antibody (with reflex)  . RPR  . HSV(herpes simplex vrs) 1+2 ab-IgM     Also discussed sexual activity, pregnancy risk, and STD risk.

## 2015-07-17 NOTE — Progress Notes (Signed)
Pre visit review using our clinic review tool, if applicable. No additional management support is needed unless otherwise documented below in the visit note. 

## 2015-07-17 NOTE — Progress Notes (Signed)
Subjective:   Patient ID: Jillian Contreras, female    DOB: 1985/11/27, 30 y.o.   MRN: [id number]  Jillian Contreras is a pleasant 30 y.o. year old female who presents to clinic today with Exposure to STD  on 07/17/2015  HPI:  Would like to be tested for STDs.  Has heard that a partner she was with this summer may have symptoms of an STD.  She denies any vaginal discharge, pelvic pain or pelvic lesions.  Not currently sexually active.  Denies dysuria.  Current Outpatient Prescriptions on File Prior to Visit  Medication Sig Dispense Refill  . diclofenac (VOLTAREN) 75 MG EC tablet Take 1 tablet (75 mg total) by mouth 2 (two) times daily. (Patient not taking: Reported on 04/05/2015) 30 tablet 0  . ferrous sulfate 325 (65 FE) MG tablet Take 325 mg by mouth daily with breakfast.    . naproxen (NAPROSYN) 250 MG tablet Take 250 mg by mouth 2 (two) times daily with a meal. Reported on 05/08/2015    . ranitidine (ZANTAC) 150 MG tablet Take 150 mg by mouth 2 (two) times daily.    . vitamin B-12 (CYANOCOBALAMIN) 1000 MCG tablet Take 1,000 mcg by mouth daily.     No current facility-administered medications on file prior to visit.    No Known Allergies  Past Medical History  Diagnosis Date  . Palpitations   . Palpitations   . Hypokalemia   . Chest pain   . Anxiety   . Panic attack     Past Surgical History  Procedure Laterality Date  . Cesarean section      Family History  Problem Relation Age of Onset  . Cancer Paternal Aunt 7145    breast  . Breast cancer Paternal Aunt   . Breast cancer Paternal Grandmother   . Breast cancer Paternal Aunt   . Breast cancer Paternal Aunt     Social History   Social History  . Marital Status: Single    Spouse Name: N/A  . Number of Children: N/A  . Years of Education: N/A   Occupational History  . customer service     [employer]   Social History Main Topics  . Smoking status: Never Smoker   . Smokeless tobacco: Never Used  .  Alcohol Use: No  . Drug Use: No  . Sexual Activity: Not on file   Other Topics Concern  . Not on file   Social History Narrative   The PMH, PSH, Social History, Family History, Medications, and allergies have been reviewed in [facility], and have been updated if relevant.  Review of Systems  Constitutional: Negative.   Gastrointestinal: Negative.   Genitourinary: Negative.   Musculoskeletal: Negative.   All other systems reviewed and are negative.      Objective:    BP 110/80 mmHg  Pulse 81  Temp(Src) 98 F (36.7 C)  Wt 203 lb (92.08 kg)  SpO2 97%   Physical Exam  Constitutional: She is oriented to person, place, and time. She appears well-developed and well-nourished. No distress.  HENT:  Head: Normocephalic and atraumatic.  Eyes: Conjunctivae are normal.  Cardiovascular: Normal rate.   Pulmonary/Chest: Effort normal.  Genitourinary: Rectum normal and vagina normal. There is no rash on the right labia. Cervix exhibits no motion tenderness, no discharge and no friability.  Lymphadenopathy:       Right: No inguinal adenopathy present.       Left: No inguinal adenopathy present.  Neurological: She is  alert and oriented to person, place, and time. No cranial nerve deficit.  Skin: Skin is warm and dry. She is not diaphoretic.  Psychiatric: She has a normal mood and affect. Her behavior is normal. Judgment and thought content normal.  Nursing note and vitals reviewed.         Assessment & Plan:   STD exposure  Screening for STD (sexually transmitted disease) - Plan: HIV antibody (with reflex), RPR, HSV(herpes simplex vrs) 1+2 ab-IgM, GC/Chlamydia Probe Amp No Follow-up on file.

## 2015-07-17 NOTE — Patient Instructions (Signed)
Great to see you. We will call you with your results from today. 

## 2015-07-18 LAB — HIV ANTIBODY (ROUTINE TESTING W REFLEX): HIV 1&2 Ab, 4th Generation: NONREACTIVE

## 2015-07-18 LAB — RPR

## 2015-07-18 LAB — HSV(HERPES SIMPLEX VRS) I + II AB-IGM: Herpes Simplex Vrs I&II-IgM Ab (EIA): 0.73 INDEX

## 2015-07-19 LAB — GC/CHLAMYDIA PROBE AMP

## 2015-09-02 ENCOUNTER — Other Ambulatory Visit: Payer: Self-pay | Admitting: Family Medicine

## 2015-09-02 DIAGNOSIS — Z01419 Encounter for gynecological examination (general) (routine) without abnormal findings: Secondary | ICD-10-CM | POA: Insufficient documentation

## 2015-09-10 ENCOUNTER — Other Ambulatory Visit (INDEPENDENT_AMBULATORY_CARE_PROVIDER_SITE_OTHER): Payer: BLUE CROSS/BLUE SHIELD

## 2015-09-10 DIAGNOSIS — Z0000 Encounter for general adult medical examination without abnormal findings: Secondary | ICD-10-CM | POA: Diagnosis not present

## 2015-09-10 DIAGNOSIS — Z01419 Encounter for gynecological examination (general) (routine) without abnormal findings: Secondary | ICD-10-CM

## 2015-09-10 LAB — CBC WITH DIFFERENTIAL/PLATELET
BASOS ABS: 0 10*3/uL (ref 0.0–0.1)
Basophils Relative: 0.4 % (ref 0.0–3.0)
EOS ABS: 0.2 10*3/uL (ref 0.0–0.7)
Eosinophils Relative: 2.2 % (ref 0.0–5.0)
HCT: 36.7 % (ref 36.0–46.0)
Hemoglobin: 12 g/dL (ref 12.0–15.0)
LYMPHS ABS: 2.7 10*3/uL (ref 0.7–4.0)
Lymphocytes Relative: 37.8 % (ref 12.0–46.0)
MCHC: 32.8 g/dL (ref 30.0–36.0)
MCV: 80.9 fl (ref 78.0–100.0)
MONO ABS: 0.6 10*3/uL (ref 0.1–1.0)
Monocytes Relative: 8.7 % (ref 3.0–12.0)
NEUTROS ABS: 3.7 10*3/uL (ref 1.4–7.7)
NEUTROS PCT: 50.9 % (ref 43.0–77.0)
PLATELETS: 304 10*3/uL (ref 150.0–400.0)
RBC: 4.54 Mil/uL (ref 3.87–5.11)
RDW: 15.3 % (ref 11.5–15.5)
WBC: 7.3 10*3/uL (ref 4.0–10.5)

## 2015-09-10 LAB — LIPID PANEL
CHOL/HDL RATIO: 5
CHOLESTEROL: 208 mg/dL — AB (ref 0–200)
HDL: 43.7 mg/dL (ref >39.00)
LDL Cholesterol: 137 mg/dL — ABNORMAL HIGH (ref 0–99)
NONHDL: 164.72
TRIGLYCERIDES: 140 mg/dL (ref 0.0–149.0)
VLDL: 28 mg/dL (ref 0.0–40.0)

## 2015-09-10 LAB — COMPREHENSIVE METABOLIC PANEL
ALT: 22 U/L (ref 0–35)
AST: 21 U/L (ref 0–37)
Albumin: 4 g/dL (ref 3.5–5.2)
Alkaline Phosphatase: 73 U/L (ref 39–117)
BILIRUBIN TOTAL: 0.2 mg/dL (ref 0.2–1.2)
BUN: 11 mg/dL (ref 6–23)
CO2: 26 meq/L (ref 19–32)
Calcium: 9.4 mg/dL (ref 8.4–10.5)
Chloride: 105 mEq/L (ref 96–112)
Creatinine, Ser: 0.71 mg/dL (ref 0.40–1.20)
GFR: 123.17 mL/min (ref >60.00)
GLUCOSE: 107 mg/dL — AB (ref 70–99)
Potassium: 4.2 mEq/L (ref 3.5–5.1)
SODIUM: 137 meq/L (ref 135–145)
Total Protein: 7.5 g/dL (ref 6.0–8.3)

## 2015-09-10 LAB — TSH: TSH: 3 u[IU]/mL (ref 0.35–4.50)

## 2015-09-17 ENCOUNTER — Encounter: Payer: BLUE CROSS/BLUE SHIELD | Admitting: Family Medicine

## 2015-09-26 ENCOUNTER — Ambulatory Visit (INDEPENDENT_AMBULATORY_CARE_PROVIDER_SITE_OTHER): Payer: BLUE CROSS/BLUE SHIELD | Admitting: Family Medicine

## 2015-09-26 ENCOUNTER — Other Ambulatory Visit (HOSPITAL_COMMUNITY)
Admission: RE | Admit: 2015-09-26 | Discharge: 2015-09-26 | Disposition: A | Discharge status: Home / Self Care | Payer: BLUE CROSS/BLUE SHIELD | Source: Ambulatory Visit | Attending: Family Medicine | Admitting: Family Medicine

## 2015-09-26 ENCOUNTER — Encounter: Payer: Self-pay | Admitting: Family Medicine

## 2015-09-26 VITALS — BP 118/66 | HR 90 | Temp 98.1°F | Ht 65.0 in | Wt 202.0 lb

## 2015-09-26 DIAGNOSIS — Z113 Encounter for screening for infections with a predominantly sexual mode of transmission: Secondary | ICD-10-CM | POA: Diagnosis not present

## 2015-09-26 DIAGNOSIS — F411 Generalized anxiety disorder: Secondary | ICD-10-CM | POA: Diagnosis not present

## 2015-09-26 DIAGNOSIS — Z1151 Encounter for screening for human papillomavirus (HPV): Secondary | ICD-10-CM | POA: Diagnosis not present

## 2015-09-26 DIAGNOSIS — Z01419 Encounter for gynecological examination (general) (routine) without abnormal findings: Secondary | ICD-10-CM | POA: Insufficient documentation

## 2015-09-26 DIAGNOSIS — Z0000 Encounter for general adult medical examination without abnormal findings: Secondary | ICD-10-CM | POA: Diagnosis not present

## 2015-09-26 MED ORDER — FERROUS SULFATE 325 (65 FE) MG PO TABS: 325.0000 mg | ORAL_TABLET | Freq: Every day | ORAL | Status: DC

## 2015-09-26 NOTE — Progress Notes (Signed)
Subjective:    Patient ID: Jillian Contreras, female    DOB: 23-Oct-1985, 30 y.o.   MRN: [id number]  HPI  30 yo pleasant female here for CPX and follow up of chronic medical conditions. Last pap smear done by me on 09/13/2014- neg. Did have an abnormal pap smear several years ago.  She is not currently sexually active.   HLD- deteriorated.  Admits to not eating well.  Lab Results  Component Value Date   CHOL 208* 09/10/2015   HDL 43.70 09/10/2015   LDLCALC 137* 09/10/2015   LDLDIRECT 149.6 08/20/2011   TRIG 140.0 09/10/2015   CHOLHDL 5 09/10/2015   Lab Results  Component Value Date   WBC 7.3 09/10/2015   HGB 12.0 09/10/2015   HCT 36.7 09/10/2015   MCV 80.9 09/10/2015   PLT 304.0 09/10/2015   Lab Results  Component Value Date   CREATININE 0.71 09/10/2015      Wt Readings from Last 3 Encounters:  07/17/15 203 lb (92.08 kg)  05/08/15 200 lb 8 oz (90.946 kg)  04/05/15 198 lb 1.9 oz (89.867 kg)   Patient Active Problem List   Diagnosis Date Noted  . Well woman exam 09/02/2015  . STD exposure 07/17/2015  . Screening for STD (sexually transmitted disease) 07/17/2015  . GERD (gastroesophageal reflux disease) 05/08/2015  . Panic attacks 12/27/2014  . Insomnia 12/27/2014  . Generalized anxiety disorder 09/13/2014  . Myalgia 07/11/2014  . Paresthesia 07/11/2014  . Burning reflux 07/03/2014  . Recurrent boils 07/03/2014  . Anemia 09/11/2013  . Menorrhagia 09/11/2013  . Hyperglycemia 09/11/2013   Past Medical History  Diagnosis Date  . Palpitations   . Palpitations   . Hypokalemia   . Chest pain   . Anxiety   . Panic attack    Past Surgical History  Procedure Laterality Date  . Cesarean section     Social History  Substance Use Topics  . Smoking status: Never Smoker   . Smokeless tobacco: Never Used  . Alcohol Use: No   Family History  Problem Relation Age of Onset  . Cancer Paternal Aunt 5445    breast  . Breast cancer Paternal Aunt   . Breast  cancer Paternal Grandmother   . Breast cancer Paternal Aunt   . Breast cancer Paternal Aunt    No Known Allergies Current Outpatient Prescriptions on File Prior to Visit  Medication Sig Dispense Refill  . diclofenac (VOLTAREN) 75 MG EC tablet Take 1 tablet (75 mg total) by mouth 2 (two) times daily. (Patient not taking: Reported on 04/05/2015) 30 tablet 0  . ferrous sulfate 325 (65 FE) MG tablet Take 325 mg by mouth daily with breakfast.    . naproxen (NAPROSYN) 250 MG tablet Take 250 mg by mouth 2 (two) times daily with a meal. Reported on 05/08/2015    . ranitidine (ZANTAC) 150 MG tablet Take 150 mg by mouth 2 (two) times daily.    . vitamin B-12 (CYANOCOBALAMIN) 1000 MCG tablet Take 1,000 mcg by mouth daily.     No current facility-administered medications on file prior to visit.   The PMH, PSH, Social History, Family History, Medications, and allergies have been reviewed in [facility], and have been updated if relevant.        Review of Systems  Constitutional: Negative.   HENT: Negative.   Respiratory: Negative.   Cardiovascular: Negative.   Gastrointestinal: Negative.   Endocrine: Negative.   Genitourinary: Negative.   Musculoskeletal: Negative.   Skin: Negative.  Allergic/Immunologic: Negative.   Neurological: Negative.   Hematological: Negative.   Psychiatric/Behavioral: Negative.   All other systems reviewed and are negative.     Objective:   Physical Exam There were no vitals taken for this visit. Wt Readings from Last 3 Encounters:  07/17/15 203 lb (92.08 kg)  05/08/15 200 lb 8 oz (90.946 kg)  04/05/15 198 lb 1.9 oz (89.867 kg)   Physical Exam   General:  Well-developed,well-nourished,in no acute distress; alert,appropriate and cooperative throughout examination Head:  normocephalic and atraumatic.   Eyes:  vision grossly intact, pupils equal, pupils round, and pupils reactive to light.   Ears:  R ear normal and L ear normal.   Nose:  no external deformity.    Mouth:  good dentition.   Neck:  No deformities, masses, or tenderness noted. Breasts:  No mass, nodules, thickening, tenderness, bulging, retraction, inflamation, nipple discharge or skin changes noted.   Lungs:  Normal respiratory effort, chest expands symmetrically. Lungs are clear to auscultation, no crackles or wheezes. Heart:  Normal rate and regular rhythm. S1 and S2 normal without gallop, murmur, click, rub or other extra sounds. Abdomen:  Bowel sounds positive,abdomen soft and non-tender without masses, organomegaly or hernias noted. Rectal:  no external abnormalities.   Genitalia:  Pelvic Exam:        External: normal female genitalia without lesions or masses        Vagina: normal without lesions or masses        Cervix: normal without lesions or masses        Adnexa: normal bimanual exam without masses or fullness        Uterus: normal by palpation        Pap smear: performed Msk:  No deformity or scoliosis noted of thoracic or lumbar spine.   Extremities:  No clubbing, cyanosis, edema, or deformity noted with normal full range of motion of all joints.   Neurologic:  alert & oriented X3 and gait normal.   Skin:  Intact without suspicious lesions or rashes Cervical Nodes:  No lymphadenopathy noted Axillary Nodes:  No palpable lymphadenopathy Psych:  Cognition and judgment appear intact. Alert and cooperative with normal attention span and concentration. No apparent delusions, illusions, hallucinations     Assessment & Plan:

## 2015-09-26 NOTE — Assessment & Plan Note (Signed)
Reviewed preventive care protocols, scheduled due services, and updated immunizations Discussed nutrition, exercise, diet, and healthy lifestyle.  Discussed USPSTF recommendations of cervical cancer screening.  She is aware that interval of 3 years is recommended but pt would prefer to have pap smear done today.  

## 2015-09-26 NOTE — Progress Notes (Signed)
Pre visit review using our clinic review tool, if applicable. No additional management support is needed unless otherwise documented below in the visit note. 

## 2015-09-26 NOTE — Patient Instructions (Signed)
Great to see you. Please incorporate more fruits and vegetables into your diet.

## 2015-09-26 NOTE — Addendum Note (Signed)
Addended by: Desmond DikeKNIGHT, Tashana Haberl H on: 09/26/2015 12:48 PM   Modules accepted: Orders, SmartSet

## 2015-09-27 LAB — CYTOLOGY - PAP

## 2015-10-01 ENCOUNTER — Encounter: Payer: Self-pay | Admitting: *Deleted

## 2016-05-12 IMAGING — CR DG CHEST 2V
2 series · 2 of 2 positions shown · non-contrast
Comparison: CT chest 01/20/2013.

CLINICAL DATA: Shortness of breath, chest pain and anxiety today.

EXAM:
CHEST  2 VIEW

[chest pa]
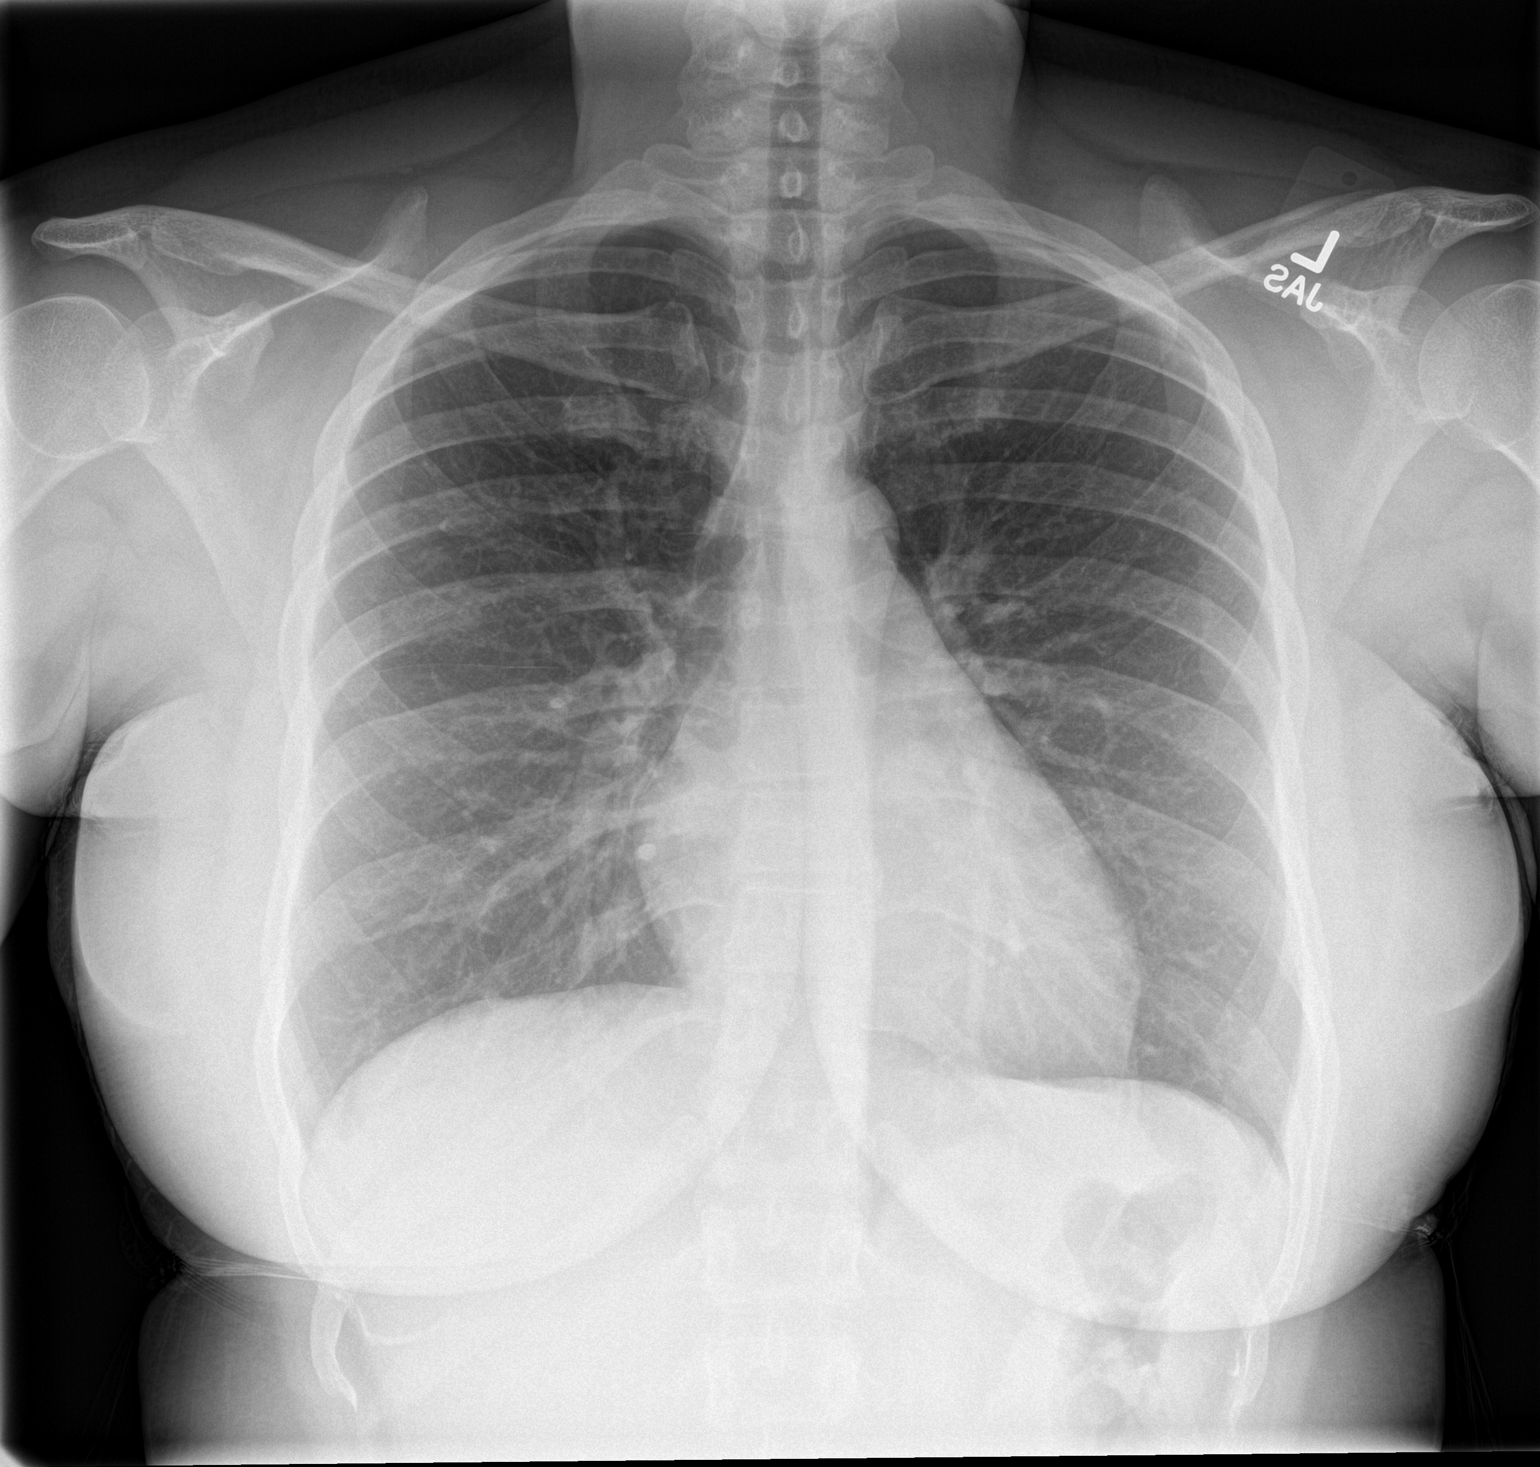

[chest lat]
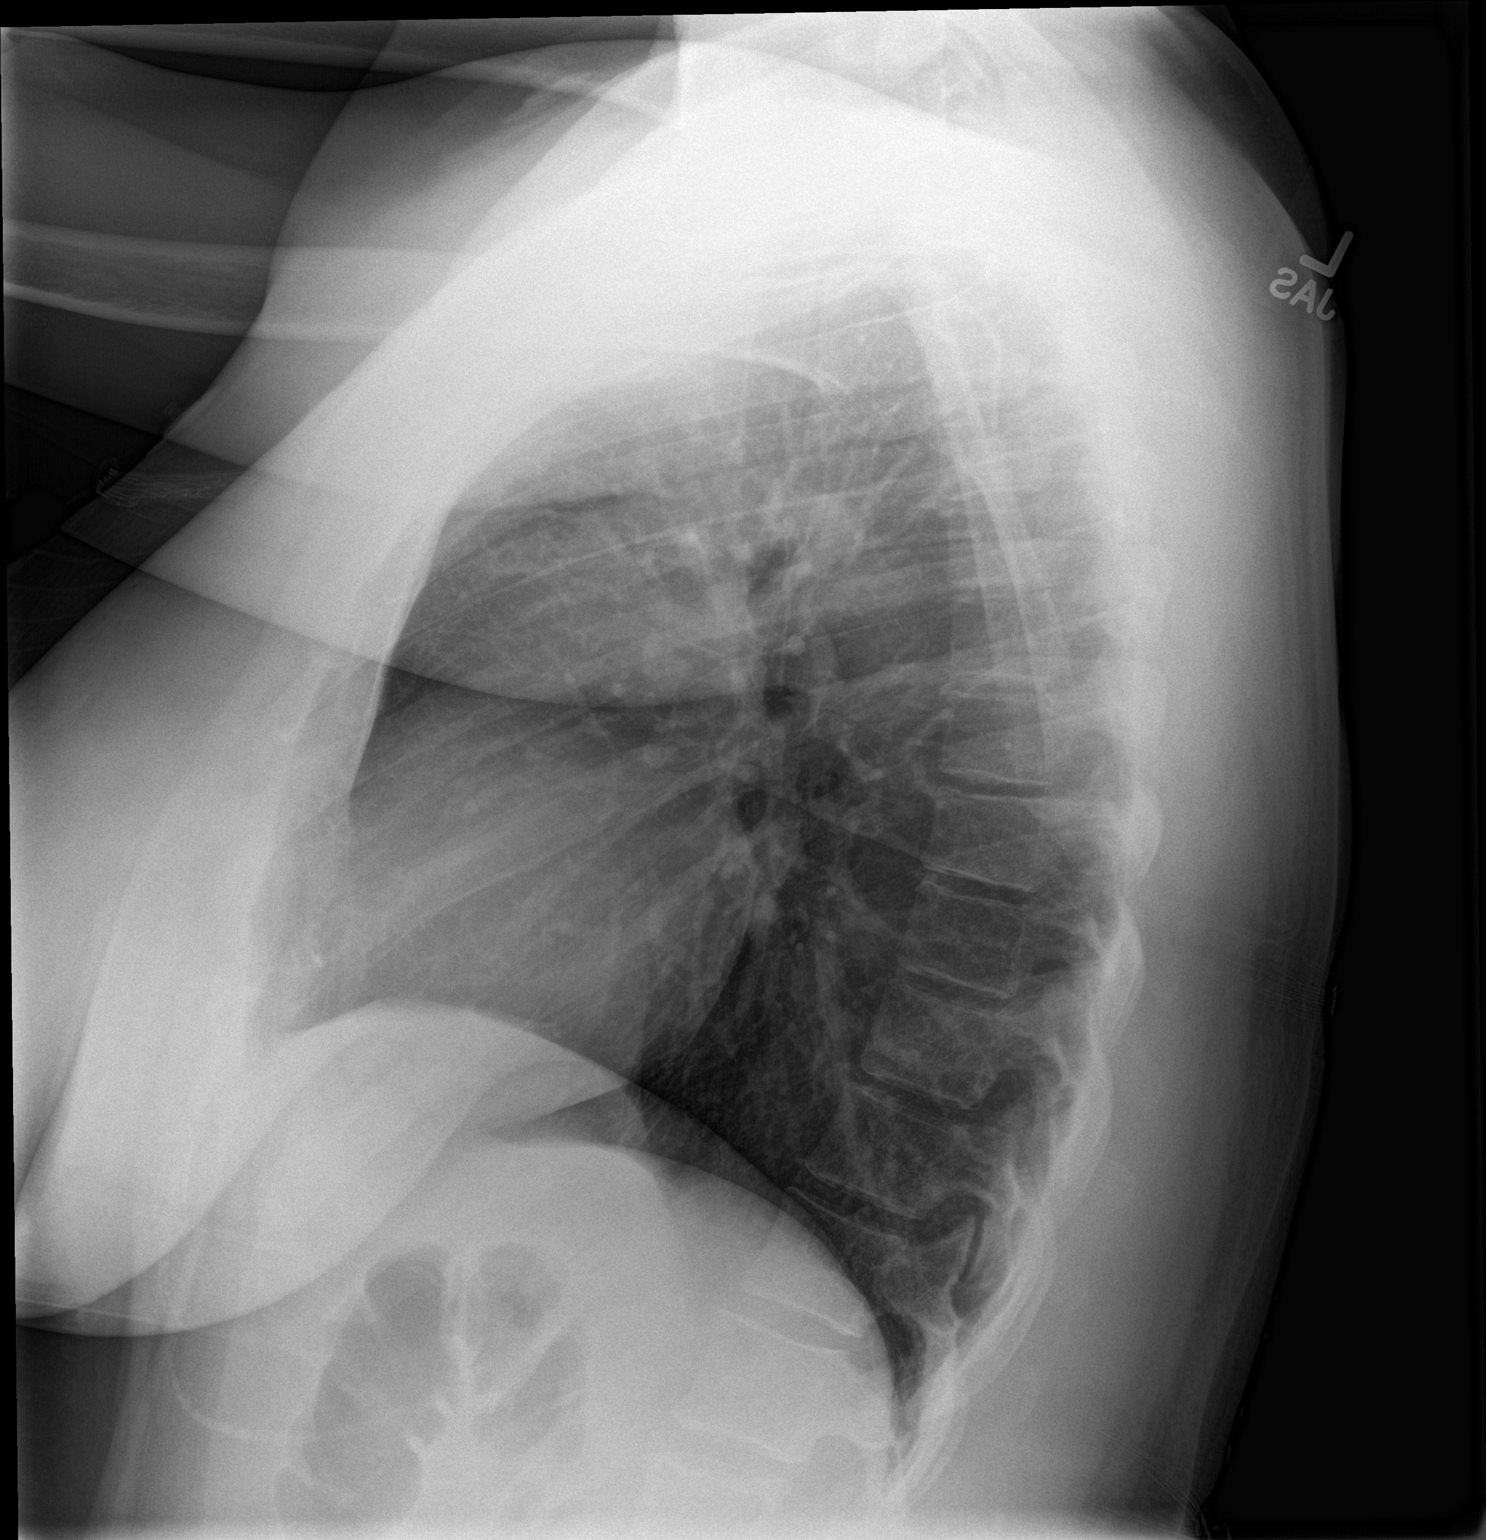

[2 of 2 positions shown; findings below may reference images not displayed]

FINDINGS: Heart size and mediastinal contours are within normal limits. Both
lungs are clear. Visualized skeletal structures are unremarkable.
IMPRESSION: Negative exam.

## 2016-05-20 ENCOUNTER — Telehealth: Payer: Self-pay | Admitting: Family Medicine

## 2016-05-20 NOTE — Telephone Encounter (Signed)
[facility] - Client TELEPHONE ADVICE RECORD [facility] Patient Name: Jillian Contreras DOB: [birth year] Initial Comment Caller says that she has had a lot of dizzy spells here lately. Has also been feeling nauseated. Nurse Assessment Nurse: Leveda AnnaHensel, RN, Aeriel Date/Time (Eastern Time): 05/20/2016 2:02:39 PM Confirm and document reason for call. If symptomatic, describe symptoms. ---Caller states, she has been having a lot of dizzy spells lately. Sometimes she will be sitting down somewhere and it feels like someone is pushing in the middle of the head and then she gets a sudden feeling of dizziness. She has not fainted she has had some nausea some days but not a lot. She has had a headache for a while and it is always on the left side of the head. Caller states, she has had a lot of heartburn recently. Currently she just has heartburn and dizziness and fatigue. Does the patient have any new or worsening symptoms? ---Yes Will a triage be completed? ---Yes Related visit to physician within the last 2 weeks? ---No Does the PT have any chronic conditions? (i.e. diabetes, asthma, etc.) ---No Is the patient pregnant or possibly pregnant? (Ask all females between the ages of 712-55) ---No Is this a behavioral health or substance abuse call? ---No Guidelines Guideline Title Affirmed Question Affirmed Notes Dizziness - Vertigo [1] MODERATE dizziness (e.g., vertigo; feels very unsteady, interferes with normal activities) AND [2] has NOT been evaluated by physician for this Abdominal Pain - Upper [1] MILD-MODERATE pain AND [2] not relieved by antacids Final Disposition User See Physician within 4 Hours (or PCP triage) Hensel, RN, Aeriel Comments She has had migraines since elementary school. Nurse attempted appt at [place] location and it would not allow me to schedule. Nurse also attempted app[facility] at [facility], [facility], and [place]. [facility]  had openings but would not allow me to schedule. Referrals [facility] Disagree/Comply: Comply

## 2016-06-16 ENCOUNTER — Ambulatory Visit: Payer: BLUE CROSS/BLUE SHIELD | Admitting: Family Medicine

## 2016-06-16 ENCOUNTER — Encounter: Payer: Self-pay | Admitting: Family Medicine

## 2016-06-16 ENCOUNTER — Ambulatory Visit (INDEPENDENT_AMBULATORY_CARE_PROVIDER_SITE_OTHER): Payer: BLUE CROSS/BLUE SHIELD | Admitting: Family Medicine

## 2016-06-16 VITALS — BP 126/82 | HR 62 | Temp 98.3°F | Wt 212.2 lb

## 2016-06-16 DIAGNOSIS — M26629 Arthralgia of temporomandibular joint, unspecified side: Secondary | ICD-10-CM | POA: Diagnosis not present

## 2016-06-16 DIAGNOSIS — F411 Generalized anxiety disorder: Secondary | ICD-10-CM | POA: Diagnosis not present

## 2016-06-16 DIAGNOSIS — K219 Gastro-esophageal reflux disease without esophagitis: Secondary | ICD-10-CM | POA: Diagnosis not present

## 2016-06-16 MED ORDER — SERTRALINE HCL 50 MG PO TABS: 50.0000 mg | ORAL_TABLET | Freq: Every day | ORAL | 3 refills | Status: DC

## 2016-06-16 MED ORDER — RANITIDINE HCL 150 MG PO TABS: 150.0000 mg | ORAL_TABLET | Freq: Two times a day (BID) | ORAL | 0 refills | Status: DC

## 2016-06-16 MED ORDER — MELOXICAM 15 MG PO TABS: 15.0000 mg | ORAL_TABLET | Freq: Every day | ORAL | 0 refills | Status: DC

## 2016-06-16 NOTE — Progress Notes (Signed)
Pre visit review using our clinic review tool, if applicable. No additional management support is needed unless otherwise documented below in the visit note. 

## 2016-06-19 NOTE — Progress Notes (Signed)
Jillian Contreras is a 31 y.o. female here for a new problem.  History of Present Illness:   Chief Complaint  Patient presents with  . Back Pain   HPI:  1. Generalized anxiety disorder. Single mother. Stressful work and home life. Does not take time out for herself. Holds body in stressed, tight position. No SI/HI. No ETOH or drugs.    2. Gastroesophageal reflux disease without esophagitis. Heartburn. Hx of the same. Not on treatment. Mild.    3. Neck pain. Neck, jaw, back pain. Diffuse. Worse throughout the day. No injury. No treatment.   PMHx, SurgHx, SocialHx, Medications, and Allergies were reviewed in the Visit Navigator and updated as appropriate.  Current Medications:   .  Cetirizine HCl 10 MG CAPS, Take by mouth. .  ferrous sulfate 325 (65 FE) MG tablet, Take 1 tablet (325 mg total) by mouth daily with breakfast. Reported on 09/26/2015  Review of Systems:   Review of Systems  Constitutional: Negative for chills and malaise/fatigue.  Eyes: Negative for blurred vision.  Respiratory: Negative for cough, hemoptysis, shortness of breath and wheezing.   Cardiovascular: Negative for palpitations and leg swelling.  Gastrointestinal: Positive for heartburn. Negative for blood in stool, constipation, diarrhea, melena, nausea and vomiting.  Musculoskeletal: Positive for joint pain and myalgias.  Skin: Negative for rash.  Neurological: Negative for dizziness.  Psychiatric/Behavioral: Negative for substance abuse and suicidal ideas. The patient is nervous/anxious.     Vitals:   Vitals:   06/16/16 1547  BP: 126/82  Pulse: 62  Temp: 98.3 F (36.8 C)  TempSrc: Oral  SpO2: 98%  Weight: 212 lb 3.2 oz (96.3 kg)     Body mass index is 35.31 kg/m.  Physical Exam:   Physical Exam  Constitutional: She appears well-nourished.  HENT:  Head: Normocephalic and atraumatic.  Eyes: EOM are normal. Pupils are equal, round, and reactive to light.  Neck: Normal range of motion. Neck  supple.  Cardiovascular: Normal rate, regular rhythm, normal heart sounds and intact distal pulses.   Pulmonary/Chest: Effort normal.  Abdominal: Soft. Bowel sounds are normal.  Musculoskeletal:  TMJ bilateral click, hypertonic masseter muscles.   Skin: Skin is warm.  Psychiatric: She has a normal mood and affect. Her behavior is normal.  Nursing note and vitals reviewed.   Assessment and Plan:    Jillian Contreras was seen today for back pain.  Diagnoses and all orders for this visit:  Generalized anxiety disorder Comments: Likely the source of most of her issues. We reviewed self-care and stress reduction. Medication as below.  Orders: -     sertraline (ZOLOFT) 50 MG tablet; Take 1 tablet (50 mg total) by mouth daily.  Gastroesophageal reflux disease without esophagitis -     ranitidine (ZANTAC) 150 MG tablet; Take 1 tablet (150 mg total) by mouth 2 (two) times daily.  TMJ syndrome Comments: Consider mouth gaurd. Orders: -     meloxicam (MOBIC) 15 MG tablet; Take 1 tablet (15 mg total) by mouth daily.   . Reviewed expectations re: course of current medical issues. . Discussed self-management of symptoms. . Outlined signs and symptoms indicating need for more acute intervention. . Patient verbalized understanding and all questions were answered. . See orders for this visit as documented in the electronic medical record. . Patient received an After-Visit Summary.  CMA served as Neurosurgeon during this visit. History, Physical, and Plan performed by medical provider. Documentation and orders reviewed and attested to. Helane Rima, D.O.  Helane Rima, D.O.

## 2016-07-22 ENCOUNTER — Other Ambulatory Visit: Payer: Self-pay | Admitting: Family Medicine

## 2016-07-22 DIAGNOSIS — M26629 Arthralgia of temporomandibular joint, unspecified side: Secondary | ICD-10-CM

## 2016-07-24 NOTE — Telephone Encounter (Signed)
Should go to PCP.

## 2016-07-24 NOTE — Telephone Encounter (Signed)
Please advise on refill.

## 2016-09-09 ENCOUNTER — Ambulatory Visit (INDEPENDENT_AMBULATORY_CARE_PROVIDER_SITE_OTHER): Payer: BLUE CROSS/BLUE SHIELD | Admitting: Family Medicine

## 2016-09-09 ENCOUNTER — Encounter: Payer: Self-pay | Admitting: Family Medicine

## 2016-09-09 VITALS — BP 110/70 | HR 64 | Temp 98.2°F | Ht 65.0 in | Wt 208.5 lb

## 2016-09-09 DIAGNOSIS — R110 Nausea: Secondary | ICD-10-CM

## 2016-09-09 DIAGNOSIS — R1032 Left lower quadrant pain: Secondary | ICD-10-CM

## 2016-09-09 LAB — CBC WITH DIFFERENTIAL/PLATELET
BASOS ABS: 0 10*3/uL (ref 0.0–0.1)
BASOS PCT: 0.6 % (ref 0.0–3.0)
EOS PCT: 1.6 % (ref 0.0–5.0)
Eosinophils Absolute: 0.1 10*3/uL (ref 0.0–0.7)
HCT: 33.5 % — ABNORMAL LOW (ref 36.0–46.0)
Hemoglobin: 11.1 g/dL — ABNORMAL LOW (ref 12.0–15.0)
LYMPHS PCT: 37.4 % (ref 12.0–46.0)
Lymphs Abs: 2.2 10*3/uL (ref 0.7–4.0)
MCHC: 33 g/dL (ref 30.0–36.0)
MCV: 78.9 fl (ref 78.0–100.0)
MONO ABS: 0.4 10*3/uL (ref 0.1–1.0)
Monocytes Relative: 7.2 % (ref 3.0–12.0)
NEUTROS ABS: 3.1 10*3/uL (ref 1.4–7.7)
NEUTROS PCT: 53.2 % (ref 43.0–77.0)
Platelets: 362 10*3/uL (ref 150.0–400.0)
RBC: 4.25 Mil/uL (ref 3.87–5.11)
RDW: 15.1 % (ref 11.5–15.5)
WBC: 5.9 10*3/uL (ref 4.0–10.5)

## 2016-09-09 LAB — POC URINALSYSI DIPSTICK (AUTOMATED)
Bilirubin, UA: NEGATIVE
Clarity, UA: NEGATIVE
Glucose, UA: NEGATIVE
Ketones, UA: NEGATIVE
LEUKOCYTES UA: NEGATIVE
NITRITE UA: NEGATIVE
PH UA: 6 (ref 5.0–8.0)
PROTEIN UA: NEGATIVE
RBC UA: NEGATIVE
Spec Grav, UA: 1.02 (ref 1.010–1.025)
Urobilinogen, UA: 0.2 E.U./dL

## 2016-09-09 LAB — POCT URINE PREGNANCY: PREG TEST UR: NEGATIVE

## 2016-09-09 LAB — COMPREHENSIVE METABOLIC PANEL
ALK PHOS: 67 U/L (ref 39–117)
ALT: 14 U/L (ref 0–35)
AST: 15 U/L (ref 0–37)
Albumin: 4.3 g/dL (ref 3.5–5.2)
BILIRUBIN TOTAL: 0.3 mg/dL (ref 0.2–1.2)
BUN: 9 mg/dL (ref 6–23)
CO2: 27 mEq/L (ref 19–32)
Calcium: 9.7 mg/dL (ref 8.4–10.5)
Chloride: 103 mEq/L (ref 96–112)
Creatinine, Ser: 0.7 mg/dL (ref 0.40–1.20)
GFR: 124.41 mL/min (ref >60.00)
GLUCOSE: 92 mg/dL (ref 70–99)
Potassium: 4 mEq/L (ref 3.5–5.1)
SODIUM: 137 meq/L (ref 135–145)
Total Protein: 7.2 g/dL (ref 6.0–8.3)

## 2016-09-09 NOTE — Progress Notes (Signed)
Subjective:   Patient ID: Jillian Contreras, female    DOB: 12/17/1985, 31 y.o.   MRN: [id number]  Jillian Contreras is a pleasant 31 y.o. year old female who presents to clinic today with Abdominal Pain (LLQ, nausea.) and Back Pain  on 09/09/2016  HPI:  LLQ pain- started approximately 1 week. Intermittent, seems to be aggravating by eating a pressing on the area.  Did have a hard BMs past few days but otherwise no changes in bowel habits.  At the worst, pain was 7-8/10.  Also had some back pain.  No hematuria, no dysuria.   Has never had pain like this before.  +nausea, no vomiting.  LMP 08/25/2016.     Current Outpatient Prescriptions on File Prior to Visit  Medication Sig Dispense Refill  . Cetirizine HCl 10 MG CAPS Take by mouth.    . ferrous sulfate 325 (65 FE) MG tablet Take 1 tablet (325 mg total) by mouth daily with breakfast. Reported on 09/26/2015 90 tablet 1  . ranitidine (ZANTAC) 150 MG tablet Take 1 tablet (150 mg total) by mouth 2 (two) times daily. 30 tablet 0  . meloxicam (MOBIC) 15 MG tablet Take 1 tablet (15 mg total) by mouth daily. (Patient not taking: Reported on 09/09/2016) 30 tablet 0  . sertraline (ZOLOFT) 50 MG tablet Take 1 tablet (50 mg total) by mouth daily. (Patient not taking: Reported on 09/09/2016) 30 tablet 3   No current facility-administered medications on file prior to visit.     No Known Allergies  Past Medical History:  Diagnosis Date  . Anxiety   . Chest pain   . Hypokalemia   . Palpitations   . Palpitations   . Panic attack     Past Surgical History:  Procedure Laterality Date  . CESAREAN SECTION      Family History  Problem Relation Age of Onset  . Cancer Paternal Aunt 77       breast  . Breast cancer Paternal Aunt   . Breast cancer Paternal Grandmother   . Breast cancer Paternal Aunt   . Breast cancer Paternal Aunt     Social History   Social History  . Marital status: Single    Spouse name: N/A  . Number of  children: N/A  . Years of education: N/A   Occupational History  . customer service     [employer]   Social History Main Topics  . Smoking status: Never Smoker  . Smokeless tobacco: Never Used  . Alcohol use No  . Drug use: No  . Sexual activity: Not on file   Other Topics Concern  . Not on file   Social History Narrative  . No narrative on file   The PMH, PSH, Social History, Family History, Medications, and allergies have been reviewed in [facility], and have been updated if relevant.   Review of Systems  Constitutional: Positive for appetite change. Negative for fever.  Respiratory: Negative.   Cardiovascular: Negative.   Gastrointestinal: Positive for abdominal distention, abdominal pain, constipation and nausea. Negative for anal bleeding, blood in stool, diarrhea, rectal pain and vomiting.  Genitourinary: Negative.   Musculoskeletal: Positive for back pain.  Psychiatric/Behavioral: Negative.   All other systems reviewed and are negative.      Objective:    BP 110/70   Pulse 64   Temp 98.2 F (36.8 C)   Ht 5\' 5"  (1.651 m)   Wt 208 lb 8 oz (94.6 kg)   LMP  08/25/2016   SpO2 98%   BMI 34.70 kg/m    Physical Exam  Constitutional: She is oriented to person, place, and time. She appears well-developed and well-nourished. No distress.  HENT:  Head: Normocephalic and atraumatic.  Eyes: Conjunctivae are normal.  Cardiovascular: Normal rate.   Pulmonary/Chest: Effort normal.  Abdominal: Soft. She exhibits distension. She exhibits no mass. There is tenderness. There is no rebound and no guarding.  Musculoskeletal: Normal range of motion.  Neurological: She is alert and oriented to person, place, and time. No cranial nerve deficit.  Skin: Skin is warm and dry. She is not diaphoretic.  Psychiatric: She has a normal mood and affect. Her behavior is normal. Judgment and thought content normal.  Nursing note and vitals reviewed.         Assessment & Plan:    Nausea - Plan: POCT Urinalysis Dipstick (Automated), POCT urine pregnancy  Left lower quadrant pain - Plan: POCT Urinalysis Dipstick (Automated), POCT urine pregnancy No Follow-up on file.

## 2016-09-09 NOTE — Patient Instructions (Signed)
Good to see you. Please stop by to see Jillian Contreras on your way out. 

## 2016-09-09 NOTE — Assessment & Plan Note (Signed)
New- intermittent. Unclear etiology at this point. UA and Upreg neg. ? Gas pain from constipation, cannot rule out other etiologies, ie colitis. Will start with labs and abd US. If symptoms continue, may need GI referral/colonoscopy. The patient indicates understanding of these issues and agrees with the plan.

## 2016-09-10 ENCOUNTER — Encounter: Payer: Self-pay | Admitting: Family Medicine

## 2016-09-10 ENCOUNTER — Ambulatory Visit
Admission: RE | Admit: 2016-09-10 | Discharge: 2016-09-10 | Disposition: A | Discharge status: Home / Self Care | Payer: BLUE CROSS/BLUE SHIELD | Source: Ambulatory Visit | Attending: Family Medicine | Admitting: Family Medicine

## 2016-09-10 ENCOUNTER — Other Ambulatory Visit: Payer: Self-pay | Admitting: Family Medicine

## 2016-09-10 DIAGNOSIS — R1032 Left lower quadrant pain: Secondary | ICD-10-CM | POA: Insufficient documentation

## 2016-09-10 DIAGNOSIS — R10817 Generalized abdominal tenderness: Secondary | ICD-10-CM | POA: Diagnosis not present

## 2016-09-10 DIAGNOSIS — R10819 Abdominal tenderness, unspecified site: Secondary | ICD-10-CM | POA: Diagnosis not present

## 2016-09-17 ENCOUNTER — Other Ambulatory Visit: Payer: Self-pay | Admitting: Family Medicine

## 2016-09-17 DIAGNOSIS — Z01419 Encounter for gynecological examination (general) (routine) without abnormal findings: Secondary | ICD-10-CM

## 2016-09-25 ENCOUNTER — Other Ambulatory Visit (INDEPENDENT_AMBULATORY_CARE_PROVIDER_SITE_OTHER): Payer: BLUE CROSS/BLUE SHIELD

## 2016-09-25 DIAGNOSIS — Z01419 Encounter for gynecological examination (general) (routine) without abnormal findings: Secondary | ICD-10-CM

## 2016-09-25 LAB — CBC WITH DIFFERENTIAL/PLATELET
Basophils Absolute: 0 10*3/uL (ref 0.0–0.1)
Basophils Relative: 0.8 % (ref 0.0–3.0)
EOS PCT: 1.9 % (ref 0.0–5.0)
Eosinophils Absolute: 0.1 10*3/uL (ref 0.0–0.7)
HCT: 33.3 % — ABNORMAL LOW (ref 36.0–46.0)
HEMOGLOBIN: 11 g/dL — AB (ref 12.0–15.0)
LYMPHS ABS: 2 10*3/uL (ref 0.7–4.0)
Lymphocytes Relative: 38.8 % (ref 12.0–46.0)
MCHC: 32.9 g/dL (ref 30.0–36.0)
MCV: 78.9 fl (ref 78.0–100.0)
Monocytes Absolute: 0.4 10*3/uL (ref 0.1–1.0)
Monocytes Relative: 8 % (ref 3.0–12.0)
NEUTROS PCT: 50.5 % (ref 43.0–77.0)
Neutro Abs: 2.6 10*3/uL (ref 1.4–7.7)
Platelets: 293 10*3/uL (ref 150.0–400.0)
RBC: 4.22 Mil/uL (ref 3.87–5.11)
RDW: 14.9 % (ref 11.5–15.5)
WBC: 5.2 10*3/uL (ref 4.0–10.5)

## 2016-09-25 LAB — COMPREHENSIVE METABOLIC PANEL
ALBUMIN: 3.9 g/dL (ref 3.5–5.2)
ALK PHOS: 69 U/L (ref 39–117)
ALT: 19 U/L (ref 0–35)
AST: 20 U/L (ref 0–37)
BUN: 9 mg/dL (ref 6–23)
CO2: 28 mEq/L (ref 19–32)
Calcium: 9.2 mg/dL (ref 8.4–10.5)
Chloride: 107 mEq/L (ref 96–112)
Creatinine, Ser: 0.82 mg/dL (ref 0.40–1.20)
GFR: 103.62 mL/min (ref >60.00)
Glucose, Bld: 116 mg/dL — ABNORMAL HIGH (ref 70–99)
POTASSIUM: 4.4 meq/L (ref 3.5–5.1)
Sodium: 139 mEq/L (ref 135–145)
TOTAL PROTEIN: 7.1 g/dL (ref 6.0–8.3)
Total Bilirubin: 0.2 mg/dL (ref 0.2–1.2)

## 2016-09-25 LAB — LIPID PANEL
Cholesterol: 185 mg/dL (ref 0–200)
HDL: 34.5 mg/dL — AB (ref >39.00)
LDL Cholesterol: 128 mg/dL — ABNORMAL HIGH (ref 0–99)
NonHDL: 150.44
Total CHOL/HDL Ratio: 5
Triglycerides: 114 mg/dL (ref 0.0–149.0)
VLDL: 22.8 mg/dL (ref 0.0–40.0)

## 2016-09-25 LAB — TSH: TSH: 2.55 u[IU]/mL (ref 0.35–4.50)

## 2016-09-29 ENCOUNTER — Other Ambulatory Visit (HOSPITAL_COMMUNITY)
Admission: RE | Admit: 2016-09-29 | Discharge: 2016-09-29 | Disposition: A | Discharge status: Home / Self Care | Payer: BLUE CROSS/BLUE SHIELD | Source: Ambulatory Visit | Attending: Family Medicine | Admitting: Family Medicine

## 2016-09-29 ENCOUNTER — Ambulatory Visit (INDEPENDENT_AMBULATORY_CARE_PROVIDER_SITE_OTHER): Payer: BLUE CROSS/BLUE SHIELD | Admitting: Family Medicine

## 2016-09-29 VITALS — BP 110/80 | HR 82 | Ht 65.0 in | Wt 208.0 lb

## 2016-09-29 DIAGNOSIS — D509 Iron deficiency anemia, unspecified: Secondary | ICD-10-CM | POA: Diagnosis not present

## 2016-09-29 DIAGNOSIS — K219 Gastro-esophageal reflux disease without esophagitis: Secondary | ICD-10-CM | POA: Diagnosis not present

## 2016-09-29 DIAGNOSIS — F410 Panic disorder [episodic paroxysmal anxiety] without agoraphobia: Secondary | ICD-10-CM | POA: Diagnosis not present

## 2016-09-29 DIAGNOSIS — Z124 Encounter for screening for malignant neoplasm of cervix: Secondary | ICD-10-CM | POA: Insufficient documentation

## 2016-09-29 DIAGNOSIS — Z01419 Encounter for gynecological examination (general) (routine) without abnormal findings: Secondary | ICD-10-CM

## 2016-09-29 NOTE — Progress Notes (Signed)
Subjective:   Patient ID: Jillian Contreras, female    DOB: 16-Oct-1985, 31 y.o.   MRN: [id number]  Jillian Contreras is a pleasant 31 y.o. year old female who presents to clinic today with Annual Exam  on 09/29/2016  HPI:  Last pap smear done by me on 09/26/15. She is aware of the guidelines but prefers to have them done yearly. Not currently sexually active.  Lab Results  Component Value Date   CHOL 185 09/25/2016   HDL 34.50 (L) 09/25/2016   LDLCALC 128 (H) 09/25/2016   LDLDIRECT 149.6 08/20/2011   TRIG 114.0 09/25/2016   CHOLHDL 5 09/25/2016   Lab Results  Component Value Date   NA 139 09/25/2016   K 4.4 09/25/2016   CL 107 09/25/2016   CO2 28 09/25/2016   Lab Results  Component Value Date   WBC 5.2 09/25/2016   HGB 11.0 (L) 09/25/2016   HCT 33.3 (L) 09/25/2016   MCV 78.9 09/25/2016   PLT 293.0 09/25/2016   Lab Results  Component Value Date   TSH 2.55 09/25/2016   Lab Results  Component Value Date   ALT 19 09/25/2016   AST 20 09/25/2016   ALKPHOS 69 09/25/2016   BILITOT 0.2 09/25/2016   Lab Results  Component Value Date   CREATININE 0.82 09/25/2016    Current Outpatient Prescriptions on File Prior to Visit  Medication Sig Dispense Refill  . Cetirizine HCl 10 MG CAPS Take by mouth.    . ferrous sulfate 325 (65 FE) MG tablet Take 1 tablet (325 mg total) by mouth daily with breakfast. Reported on 09/26/2015 90 tablet 1  . ranitidine (ZANTAC) 150 MG tablet Take 1 tablet (150 mg total) by mouth 2 (two) times daily. 30 tablet 0  . sertraline (ZOLOFT) 50 MG tablet Take 1 tablet (50 mg total) by mouth daily. 30 tablet 3   No current facility-administered medications on file prior to visit.     No Known Allergies  Past Medical History:  Diagnosis Date  . Anxiety   . Chest pain   . Hypokalemia   . Palpitations   . Palpitations   . Panic attack     Past Surgical History:  Procedure Laterality Date  . CESAREAN SECTION      Family History  Problem  Relation Age of Onset  . Cancer Paternal Aunt 5645       breast  . Breast cancer Paternal Aunt   . Breast cancer Paternal Grandmother   . Breast cancer Paternal Aunt   . Breast cancer Paternal Aunt     Social History   Social History  . Marital status: Single    Spouse name: N/A  . Number of children: N/A  . Years of education: N/A   Occupational History  . customer service     [employer]   Social History Main Topics  . Smoking status: Never Smoker  . Smokeless tobacco: Never Used  . Alcohol use No  . Drug use: No  . Sexual activity: Not on file   Other Topics Concern  . Not on file   Social History Narrative  . No narrative on file   The PMH, PSH, Social History, Family History, Medications, and allergies [facility], and have been updated if relevant.  Review of Systems  Constitutional: Negative.   HENT: Negative.   Eyes: Negative.   Respiratory: Negative.   Cardiovascular: Negative.   Gastrointestinal: Negative.   Endocrine: Negative.  Genitourinary: Negative.   Musculoskeletal: Negative.   Allergic/Immunologic: Negative.   Neurological: Negative.   Hematological: Negative.   Psychiatric/Behavioral: Negative.   All other systems reviewed and are negative.      Objective:    BP 110/80   Pulse 82   Ht 5\' 5"  (1.651 m)   Wt 208 lb (94.3 kg)   LMP 09/22/2016   SpO2 98%   BMI 34.61 kg/m    Physical Exam   General:  Well-developed,well-nourished,in no acute distress; alert,appropriate and cooperative throughout examination Head:  normocephalic and atraumatic.   Eyes:  vision grossly intact, PERRL Ears:  R ear normal and L ear normal externally, TMs clear bilaterally Nose:  no external deformity.   Mouth:  good dentition.   Neck:  No deformities, masses, or tenderness noted. Breasts:  No mass, nodules, thickening, tenderness, bulging, retraction, inflamation, nipple discharge or skin changes noted.   Lungs:  Normal  respiratory effort, chest expands symmetrically. Lungs are clear to auscultation, no crackles or wheezes. Heart:  Normal rate and regular rhythm. S1 and S2 normal without gallop, murmur, click, rub or other extra sounds. Abdomen:  Bowel sounds positive,abdomen soft and non-tender without masses, organomegaly or hernias noted. Rectal:  no external abnormalities.   Genitalia:  Pelvic Exam:        External: normal female genitalia without lesions or masses        Vagina: normal without lesions or masses        Cervix: normal without lesions or masses        Adnexa: normal bimanual exam without masses or fullness        Uterus: normal by palpation        Pap smear: performed Msk:  No deformity or scoliosis noted of thoracic or lumbar spine.   Extremities:  No clubbing, cyanosis, edema, or deformity noted with normal full range of motion of all joints.   Neurologic:  alert & oriented X3 and gait normal.   Skin:  Intact without suspicious lesions or rashes Cervical Nodes:  No lymphadenopathy noted Axillary Nodes:  No palpable lymphadenopathy Psych:  Cognition and judgment appear intact. Alert and cooperative with normal attention span and concentration. No apparent delusions, illusions, hallucinations       Assessment & Plan:   Panic attacks  Gastroesophageal reflux disease without esophagitis  Iron deficiency anemia, unspecified iron deficiency anemia type  Well woman exam with routine gynecological exam No Follow-up on file.

## 2016-09-29 NOTE — Assessment & Plan Note (Signed)
Reviewed preventive care protocols, scheduled due services, and updated immunizations Discussed nutrition, exercise, diet, and healthy lifestyle.  Discussed USPSTF recommendations of cervical cancer screening.  She is aware that interval of 3 years is recommended but pt would prefer to have pap smear done today.  

## 2016-10-01 LAB — CYTOLOGY - PAP
CANDIDA VAGINITIS: NEGATIVE
CHLAMYDIA, DNA PROBE: NEGATIVE
DIAGNOSIS: NEGATIVE
HPV: NOT DETECTED
Neisseria Gonorrhea: NEGATIVE

## 2016-10-02 LAB — CERVICOVAGINAL ANCILLARY ONLY: Herpes: NEGATIVE

## 2016-10-20 IMAGING — CR DG CHEST 2V
2 series · 2 of 2 positions shown · non-contrast
Comparison: 07/23/2014

CLINICAL DATA: Left-sided chest pain to the back and back pain down
the left arm for about a week.

EXAM:
CHEST  2 VIEW

[chest pa]
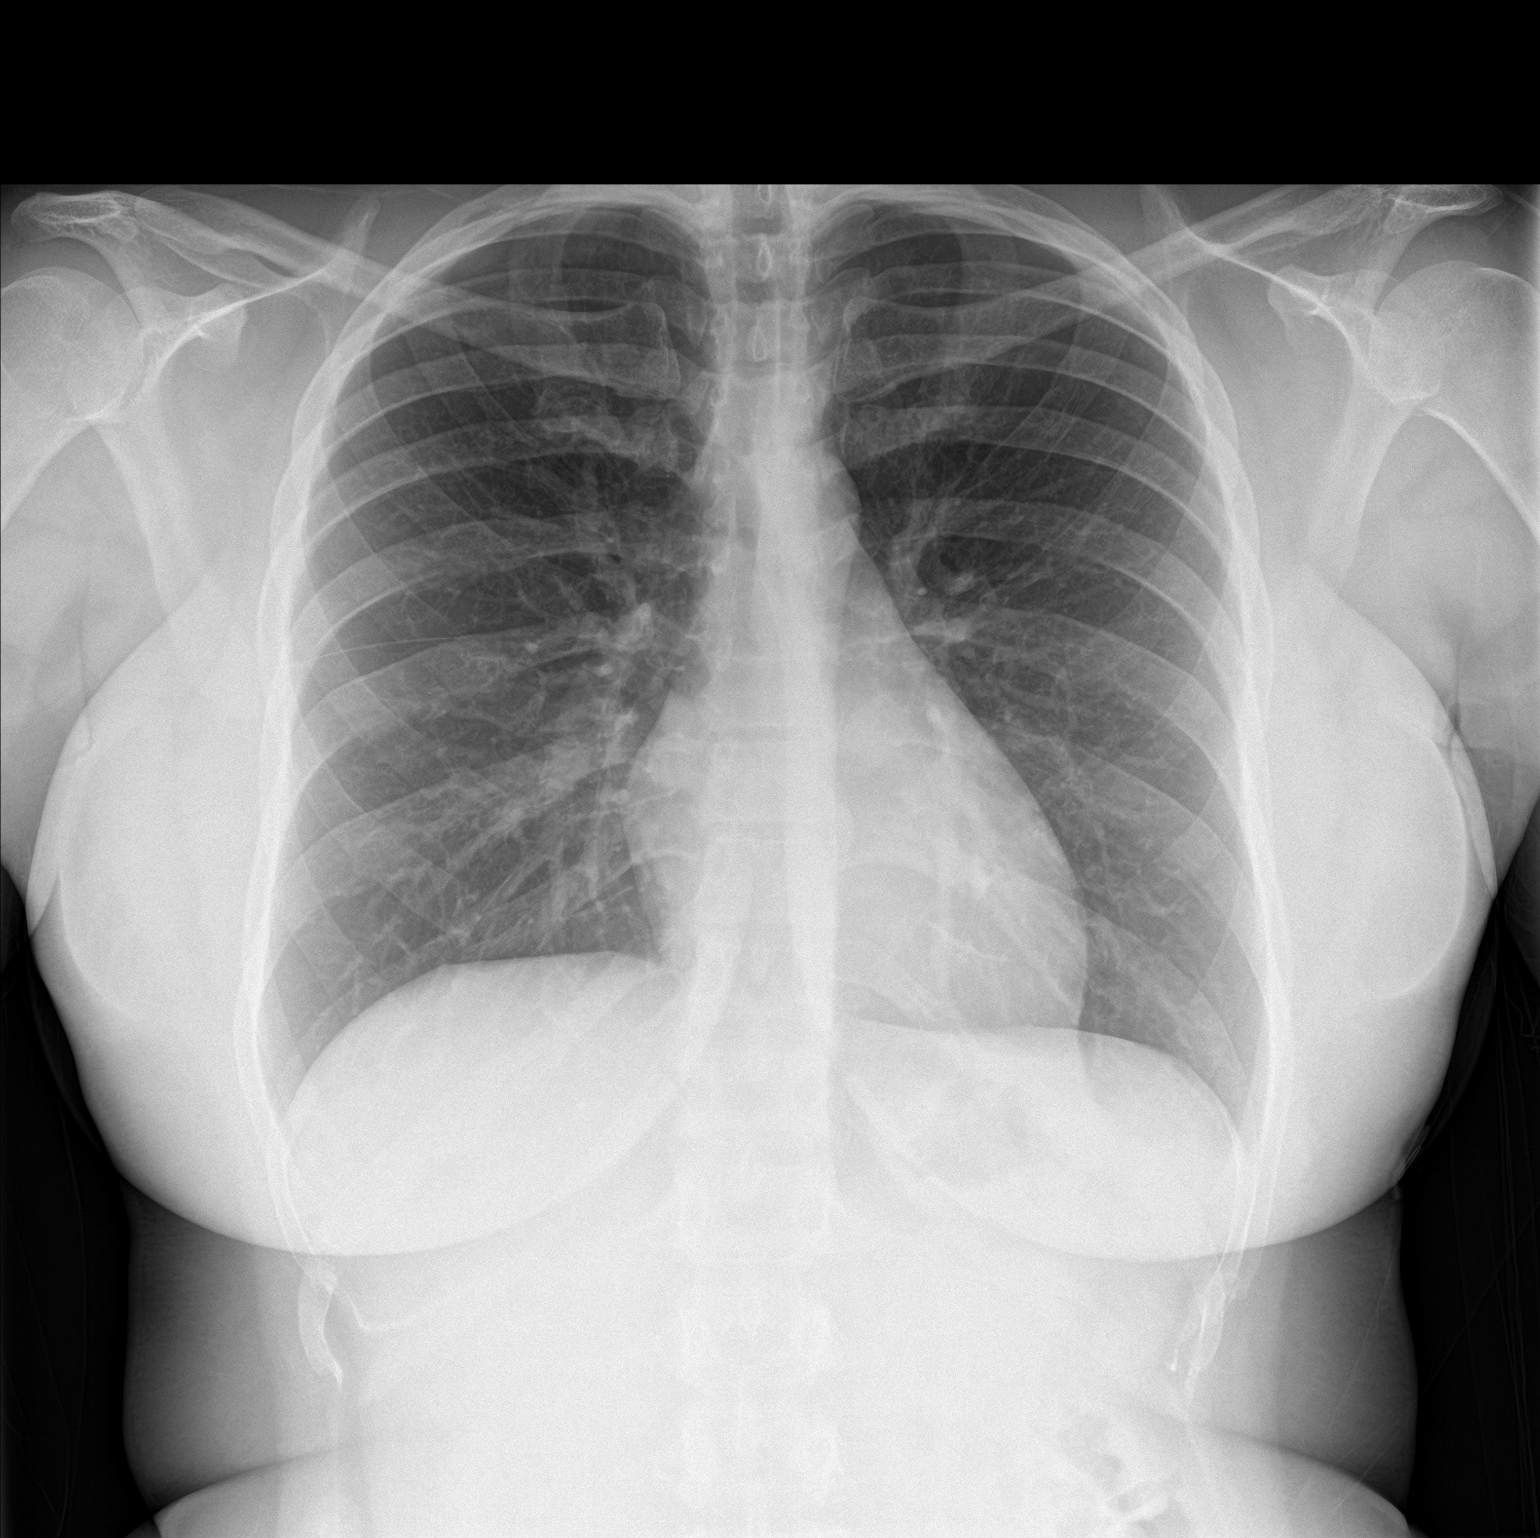

[chest lat]
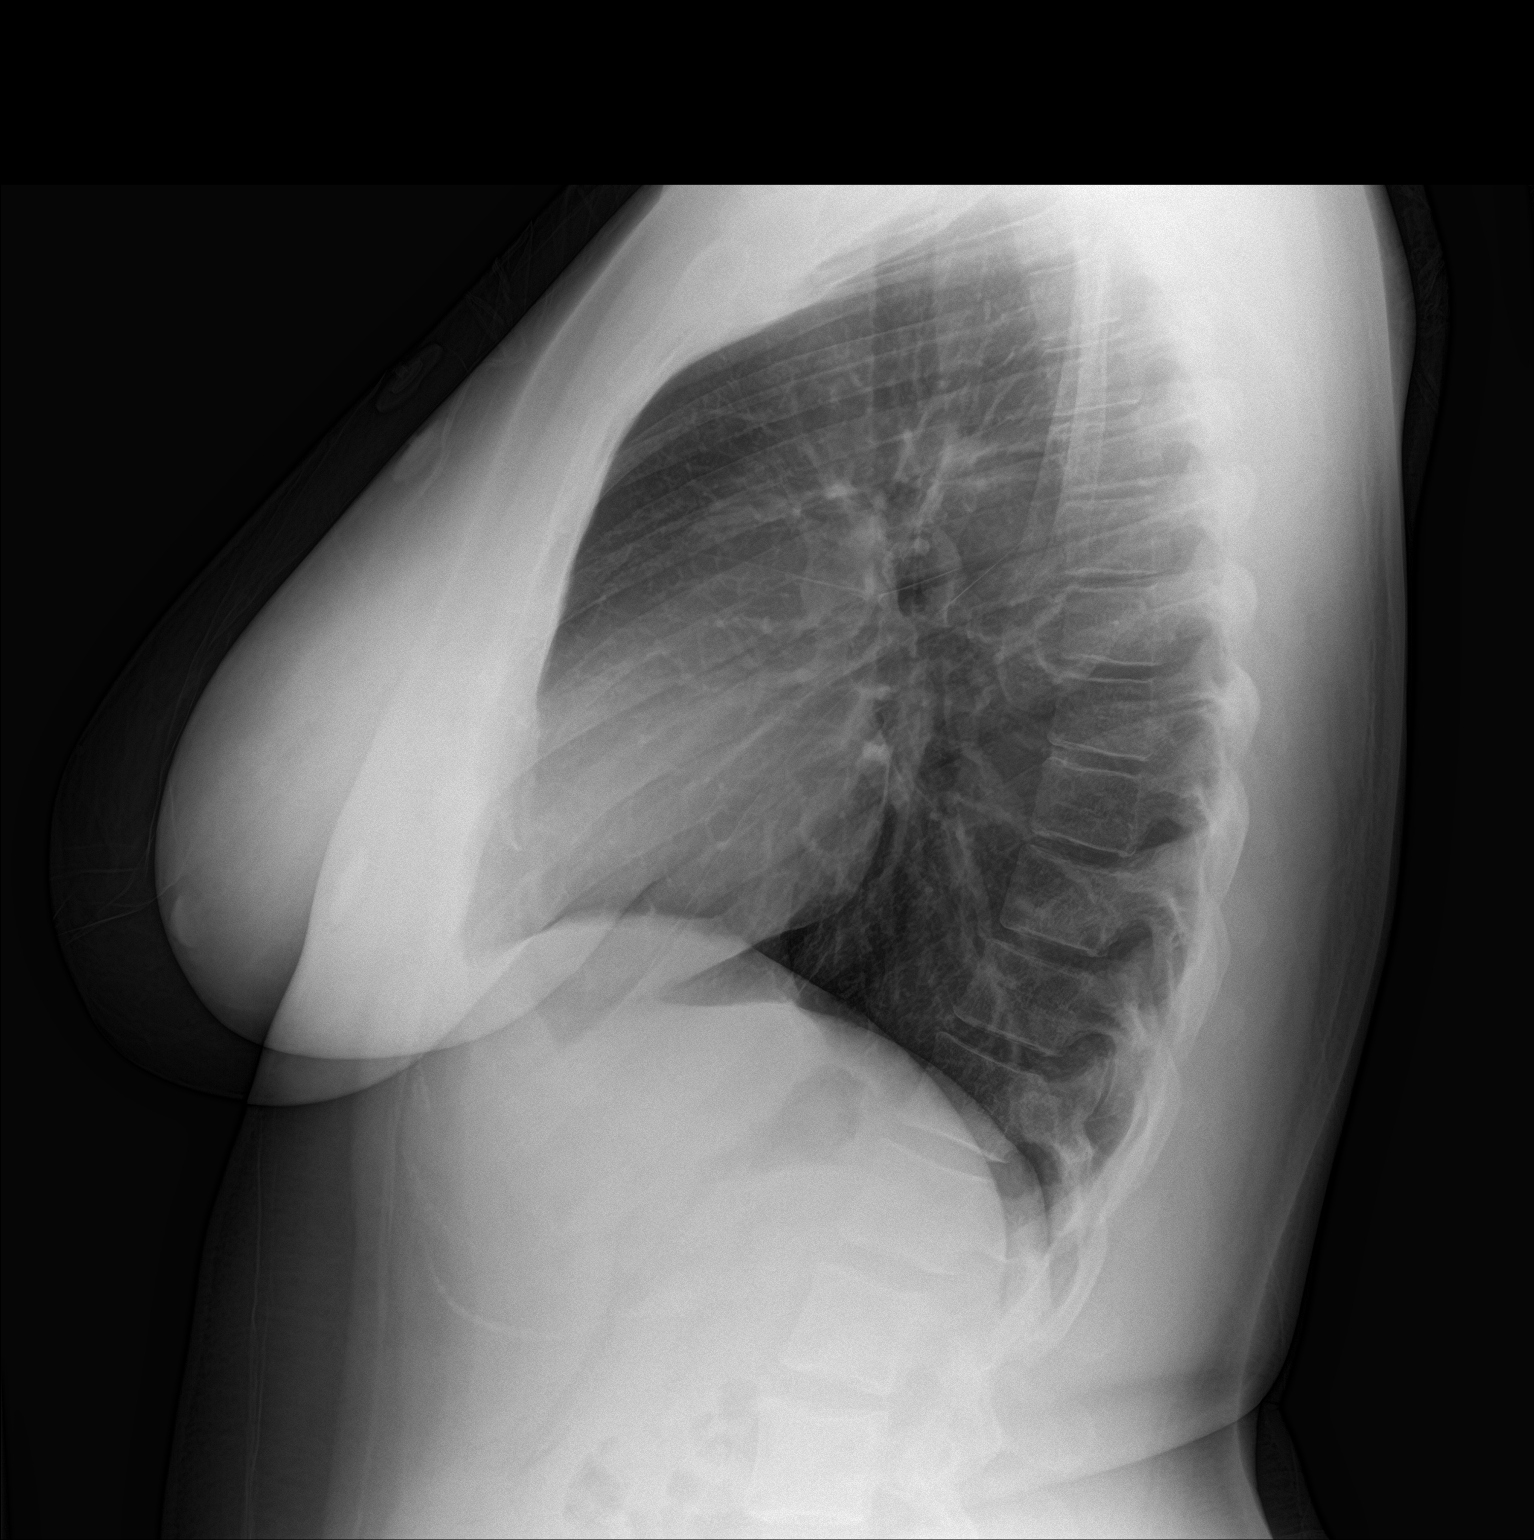

[2 of 2 positions shown; findings below may reference images not displayed]

FINDINGS: The heart size and mediastinal contours are within normal limits.
Both lungs are clear. The visualized skeletal structures are
unremarkable.
IMPRESSION: No active cardiopulmonary disease.

## 2016-12-13 IMAGING — CR DG CHEST 2V
2 series · 2 of 2 positions shown · non-contrast
Comparison: Chest radiograph dated 12/31/2014

CLINICAL DATA: 29-year-old female with chest pain and shortness of
breath

EXAM:
CHEST  2 VIEW

[chest pa]
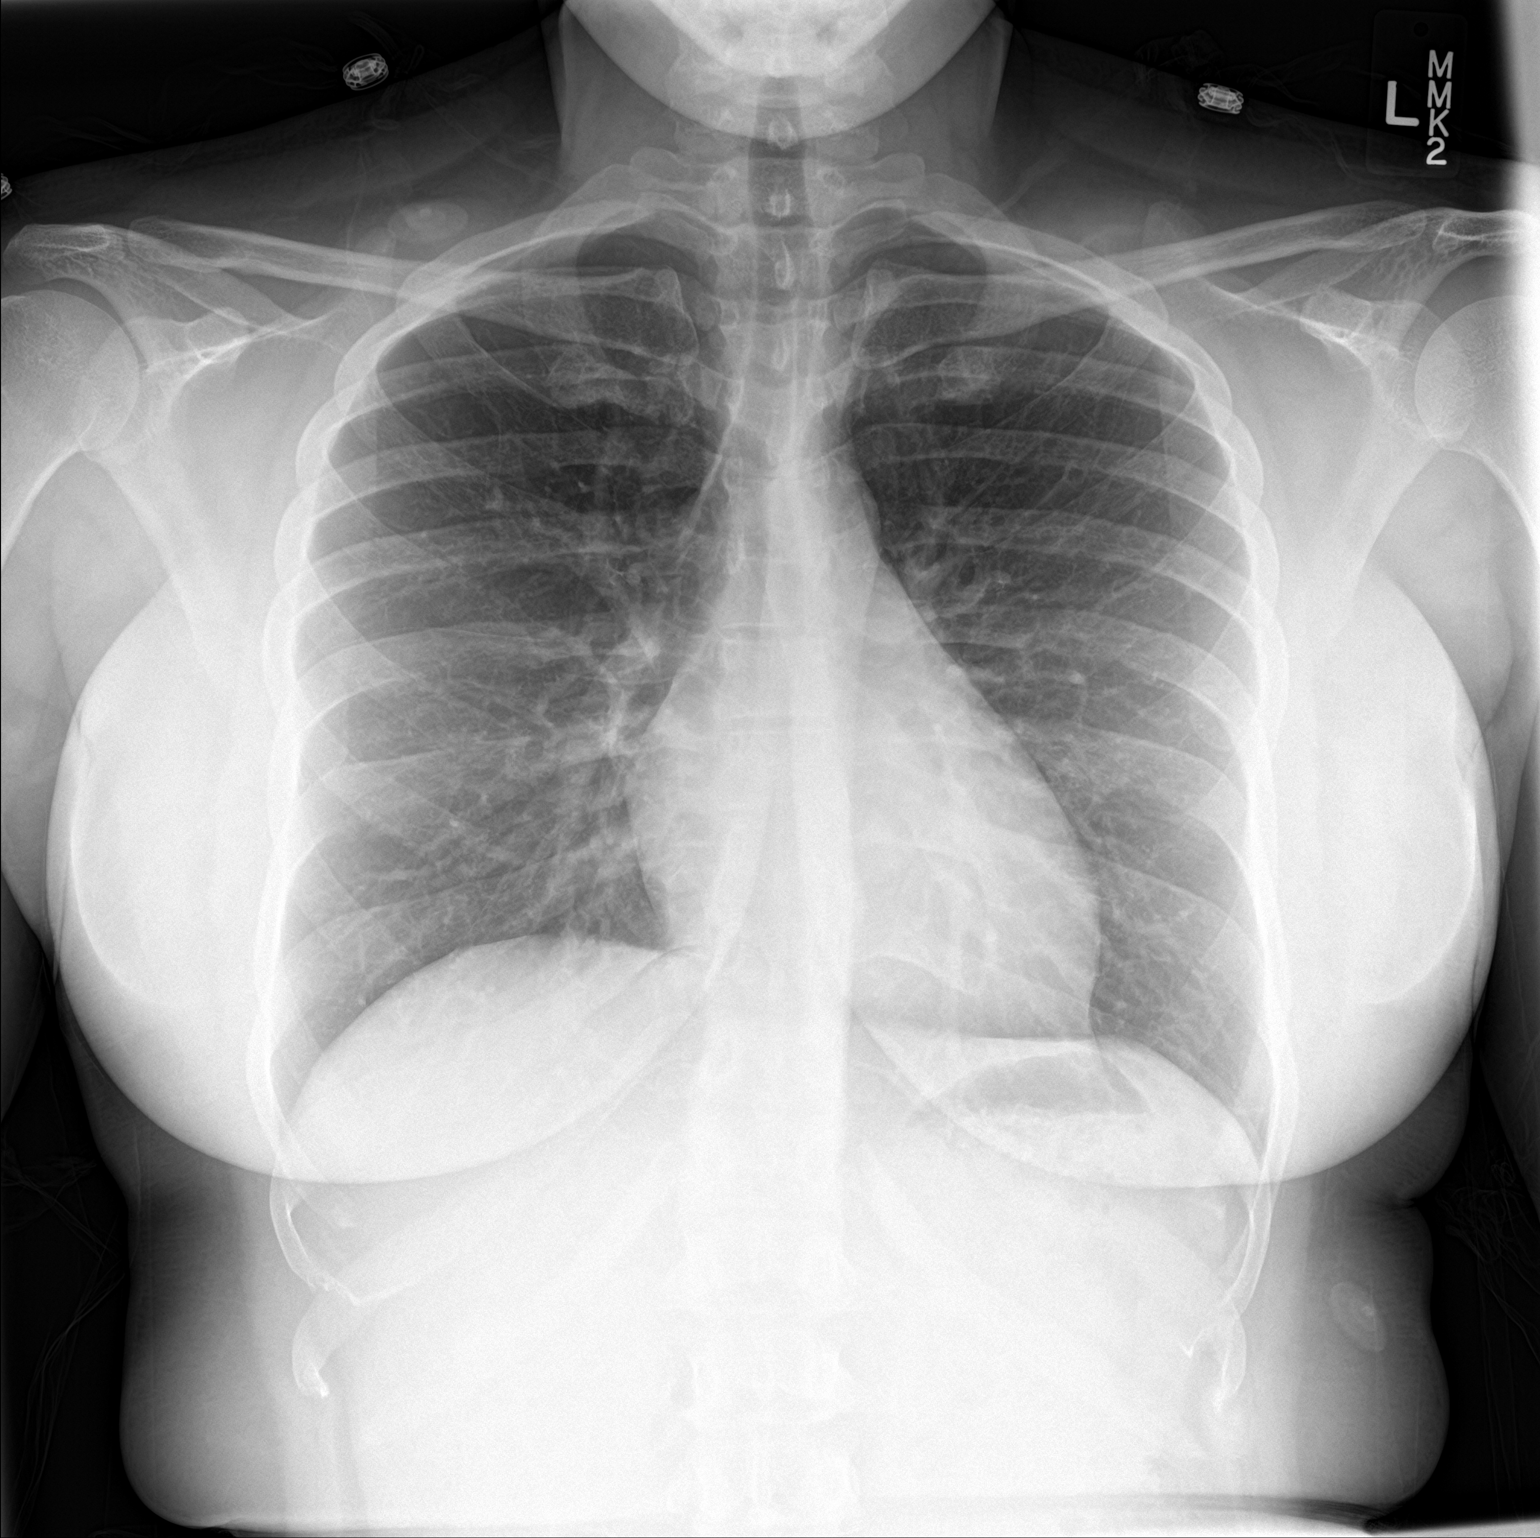

[chest lat]
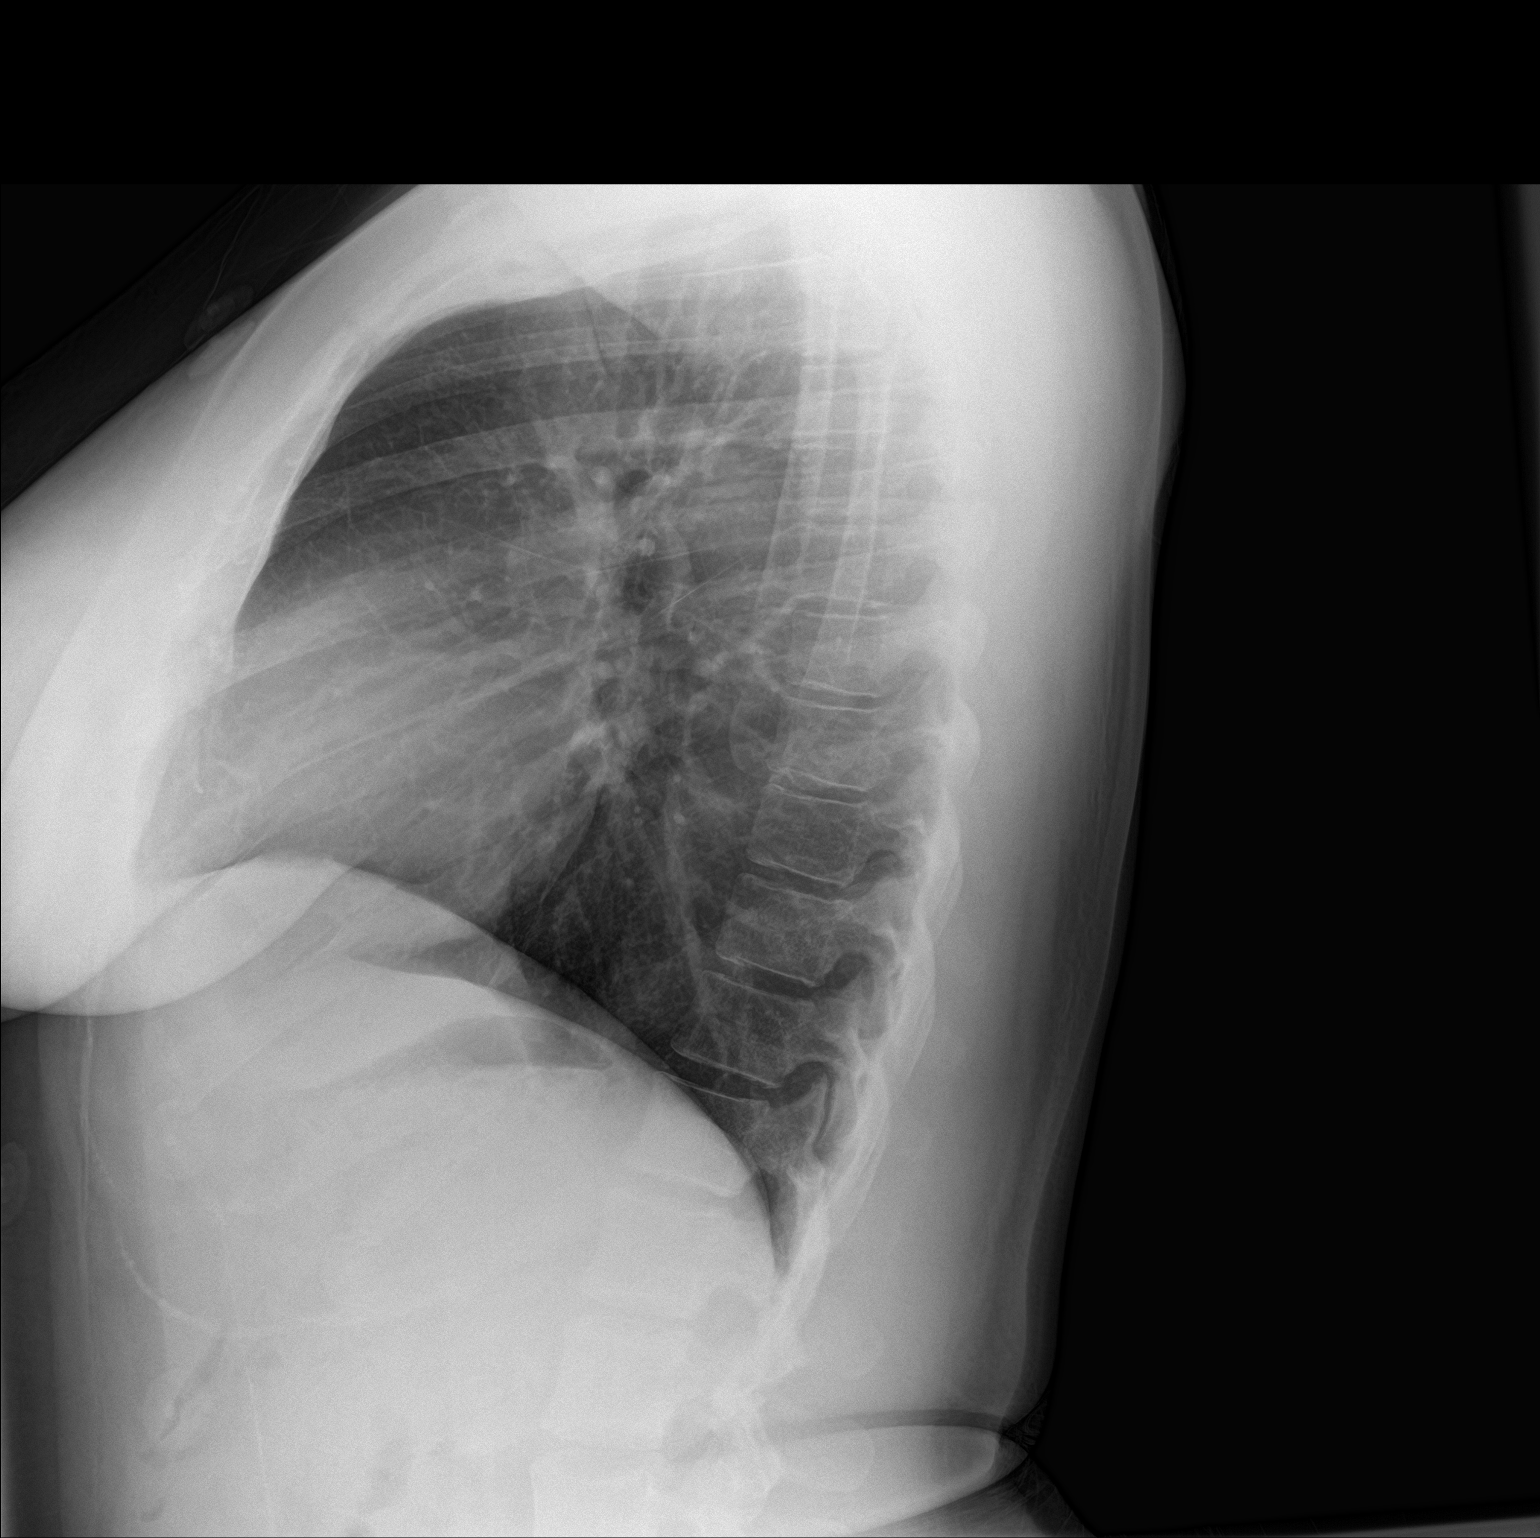

[2 of 2 positions shown; findings below may reference images not displayed]

FINDINGS: The heart size and mediastinal contours are within normal limits.
Both lungs are clear. The visualized skeletal structures are
unremarkable.
IMPRESSION: No active cardiopulmonary disease.

## 2017-09-07 ENCOUNTER — Telehealth: Payer: Self-pay | Admitting: Family Medicine

## 2017-09-07 NOTE — Telephone Encounter (Signed)
Left message for patient to call. Dr. Dayton Martesaron will not be in office on 7/15 and need to reschedule appt.

## 2017-09-29 ENCOUNTER — Encounter: Payer: Self-pay | Admitting: Family Medicine

## 2017-09-29 ENCOUNTER — Ambulatory Visit (INDEPENDENT_AMBULATORY_CARE_PROVIDER_SITE_OTHER): Payer: BLUE CROSS/BLUE SHIELD | Admitting: Family Medicine

## 2017-09-29 ENCOUNTER — Other Ambulatory Visit (HOSPITAL_COMMUNITY)
Admission: RE | Admit: 2017-09-29 | Discharge: 2017-09-29 | Disposition: A | Discharge status: Home / Self Care | Payer: BLUE CROSS/BLUE SHIELD | Source: Ambulatory Visit | Attending: Family Medicine | Admitting: Family Medicine

## 2017-09-29 VITALS — BP 104/64 | HR 68 | Temp 98.2°F | Ht 65.5 in | Wt 215.4 lb

## 2017-09-29 DIAGNOSIS — D509 Iron deficiency anemia, unspecified: Secondary | ICD-10-CM

## 2017-09-29 DIAGNOSIS — Z0001 Encounter for general adult medical examination with abnormal findings: Secondary | ICD-10-CM | POA: Insufficient documentation

## 2017-09-29 DIAGNOSIS — Z01419 Encounter for gynecological examination (general) (routine) without abnormal findings: Secondary | ICD-10-CM | POA: Diagnosis not present

## 2017-09-29 DIAGNOSIS — Z0000 Encounter for general adult medical examination without abnormal findings: Secondary | ICD-10-CM | POA: Insufficient documentation

## 2017-09-29 LAB — CBC WITH DIFFERENTIAL/PLATELET
BASOS PCT: 0.7 % (ref 0.0–3.0)
Basophils Absolute: 0 10*3/uL (ref 0.0–0.1)
EOS PCT: 1.2 % (ref 0.0–5.0)
Eosinophils Absolute: 0.1 10*3/uL (ref 0.0–0.7)
HEMATOCRIT: 34.2 % — AB (ref 36.0–46.0)
HEMOGLOBIN: 11.4 g/dL — AB (ref 12.0–15.0)
LYMPHS PCT: 34.8 % (ref 12.0–46.0)
Lymphs Abs: 2.2 10*3/uL (ref 0.7–4.0)
MCHC: 33.3 g/dL (ref 30.0–36.0)
MCV: 80.7 fl (ref 78.0–100.0)
MONO ABS: 0.5 10*3/uL (ref 0.1–1.0)
MONOS PCT: 7.3 % (ref 3.0–12.0)
Neutro Abs: 3.5 10*3/uL (ref 1.4–7.7)
Neutrophils Relative %: 56 % (ref 43.0–77.0)
Platelets: 322 10*3/uL (ref 150.0–400.0)
RBC: 4.24 Mil/uL (ref 3.87–5.11)
RDW: 15.2 % (ref 11.5–15.5)
WBC: 6.3 10*3/uL (ref 4.0–10.5)

## 2017-09-29 LAB — COMPREHENSIVE METABOLIC PANEL
ALBUMIN: 4 g/dL (ref 3.5–5.2)
ALK PHOS: 81 U/L (ref 39–117)
ALT: 25 U/L (ref 0–35)
AST: 17 U/L (ref 0–37)
BUN: 10 mg/dL (ref 6–23)
CALCIUM: 9.1 mg/dL (ref 8.4–10.5)
CO2: 27 mEq/L (ref 19–32)
Chloride: 105 mEq/L (ref 96–112)
Creatinine, Ser: 0.69 mg/dL (ref 0.40–1.20)
GFR: 125.68 mL/min (ref >60.00)
GLUCOSE: 109 mg/dL — AB (ref 70–99)
POTASSIUM: 3.9 meq/L (ref 3.5–5.1)
SODIUM: 138 meq/L (ref 135–145)
TOTAL PROTEIN: 7.3 g/dL (ref 6.0–8.3)
Total Bilirubin: 0.3 mg/dL (ref 0.2–1.2)

## 2017-09-29 LAB — LIPID PANEL
CHOLESTEROL: 197 mg/dL (ref 0–200)
HDL: 43.4 mg/dL (ref >39.00)
LDL Cholesterol: 124 mg/dL — ABNORMAL HIGH (ref 0–99)
NonHDL: 153.2
TRIGLYCERIDES: 144 mg/dL (ref 0.0–149.0)
Total CHOL/HDL Ratio: 5
VLDL: 28.8 mg/dL (ref 0.0–40.0)

## 2017-09-29 LAB — TSH: TSH: 1.46 u[IU]/mL (ref 0.35–4.50)

## 2017-09-29 NOTE — Patient Instructions (Addendum)
Great to see you. I will call you with your lab results from today and you can view them online.   

## 2017-09-29 NOTE — Assessment & Plan Note (Signed)
Reviewed preventive care protocols, scheduled due services, and updated immunizations Discussed nutrition, exercise, diet, and healthy lifestyle.  Discussed USPSTF recommendations of cervical cancer screening.  She is aware that interval of 3 years is recommended but pt would prefer to have pap smear done today.  

## 2017-09-29 NOTE — Progress Notes (Signed)
Subjective:   Patient ID: Jillian Contreras, female    DOB: 12-May-1985, 32 y.o.   MRN: [id number]  Jillian SavoyCrystal L Chenette is a pleasant 32 y.o. year old female who presents to clinic today with Annual Exam  on 09/29/2017  HPI:  Not currently sexually active. She would like her pap smear done today.  Has no complaints today.  Never started the zoloft- denies any symptoms of anxiety or depression. Health Maintenance  Topic Date Due  . TETANUS/TDAP  09/30/2018 (Originally 04/23/2003)  . INFLUENZA VACCINE  10/21/2017  . PAP SMEAR  09/30/2019  . HIV Screening  Completed    Current Outpatient Medications on File Prior to Visit  Medication Sig Dispense Refill  . Cetirizine HCl 10 MG CAPS Take by mouth.     No current facility-administered medications on file prior to visit.     No Known Allergies  Past Medical History:  Diagnosis Date  . Anxiety   . Chest pain   . Hypokalemia   . Palpitations   . Palpitations   . Panic attack     Past Surgical History:  Procedure Laterality Date  . CESAREAN SECTION      Family History  Problem Relation Age of Onset  . Cancer Paternal Aunt 6045       breast  . Breast cancer Paternal Aunt   . Breast cancer Paternal Grandmother   . Breast cancer Paternal Aunt   . Breast cancer Paternal Aunt     Social History   Socioeconomic History  . Marital status: Single    Spouse name: Not on file  . Number of children: Not on file  . Years of education: Not on file  . Highest education level: Not on file  Occupational History  . Occupation: customer service    Comment: [employer]  Social Needs  . Financial resource strain: Not on file  . Food insecurity:    Worry: Not on file    Inability: Not on file  . Transportation needs:    Medical: Not on file    Non-medical: Not on file  Tobacco Use  . Smoking status: Never Smoker  . Smokeless tobacco: Never Used  Substance and Sexual Activity  . Alcohol use: No    Alcohol/week: 0.0 oz    . Drug use: No  . Sexual activity: Not on file  Lifestyle  . Physical activity:    Days per week: Not on file    Minutes per session: Not on file  . Stress: Not on file  Relationships  . Social connections:    Talks on phone: Not on file    Gets together: Not on file    Attends religious service: Not on file    Active member of club or organization: Not on file    Attends meetings of clubs or organizations: Not on file    Relationship status: Not on file  . Intimate partner violence:    Fear of current or ex partner: Not on file    Emotionally abused: Not on file    Physically abused: Not on file    Forced sexual activity: Not on file  Other Topics Concern  . Not on file  Social History Narrative  . Not on file   The PMH, PSH, Social History, Family History, Medications, and allergies have been reviewed in [facility], and have been updated if relevant.   Review of Systems  Constitutional: Negative.   HENT: Negative.   Eyes: Negative.  Respiratory: Negative.   Cardiovascular: Negative.   Gastrointestinal: Negative.   Endocrine: Negative.   Genitourinary: Negative.   Musculoskeletal: Negative.   Allergic/Immunologic: Negative.   Neurological: Negative.   Hematological: Negative.   Psychiatric/Behavioral: Negative.   All other systems reviewed and are negative.      Objective:    BP 104/64 (BP Location: Left Arm, Patient Position: Sitting, Cuff Size: Normal)   Pulse 68   Temp 98.2 F (36.8 C) (Oral)   Ht 5' 5.5" (1.664 m)   Wt 215 lb 6.4 oz (97.7 kg)   LMP 09/10/2017 (Exact Date)   SpO2 97%   BMI 35.30 kg/m    Physical Exam    General:  Well-developed,well-nourished,in no acute distress; alert,appropriate and cooperative throughout examination Head:  normocephalic and atraumatic.   Eyes:  vision grossly intact, PERRL Ears:  R ear normal and L ear normal externally, TMs clear bilaterally Nose:  no external deformity.   Mouth:  good dentition.   Neck:  No  deformities, masses, or tenderness noted. Breasts:  No mass, nodules, thickening, tenderness, bulging, retraction, inflamation, nipple discharge or skin changes noted.   Lungs:  Normal respiratory effort, chest expands symmetrically. Lungs are clear to auscultation, no crackles or wheezes. Heart:  Normal rate and regular rhythm. S1 and S2 normal without gallop, murmur, click, rub or other extra sounds. Abdomen:  Bowel sounds positive,abdomen soft and non-tender without masses, organomegaly or hernias noted. Rectal:  no external abnormalities.   Genitalia:  Pelvic Exam:        External: normal female genitalia without lesions or masses        Vagina: normal without lesions or masses        Cervix: normal without lesions or masses        Adnexa: normal bimanual exam without masses or fullness        Uterus: normal by palpation        Pap smear: performed Msk:  No deformity or scoliosis noted of thoracic or lumbar spine.   Extremities:  No clubbing, cyanosis, edema, or deformity noted with normal full range of motion of all joints.   Neurologic:  alert & oriented X3 and gait normal.   Skin:  Intact without suspicious lesions or rashes Cervical Nodes:  No lymphadenopathy noted Axillary Nodes:  No palpable lymphadenopathy Psych:  Cognition and judgment appear intact. Alert and cooperative with normal attention span and concentration. No apparent delusions, illusions, hallucinations      Assessment & Plan:   Iron deficiency anemia, unspecified iron deficiency anemia type - Plan: CBC with Differential/Platelet, Comprehensive metabolic panel, TSH  Well woman exam with routine gynecological exam - Plan: Lipid panel No follow-ups on file.

## 2017-09-30 LAB — CYTOLOGY - PAP
Diagnosis: NEGATIVE
HPV: NOT DETECTED

## 2017-10-04 ENCOUNTER — Encounter: Payer: BLUE CROSS/BLUE SHIELD | Admitting: Family Medicine

## 2017-10-05 ENCOUNTER — Ambulatory Visit: Payer: BLUE CROSS/BLUE SHIELD | Admitting: Family Medicine

## 2017-10-07 ENCOUNTER — Ambulatory Visit: Payer: BLUE CROSS/BLUE SHIELD | Admitting: Family Medicine

## 2018-01-06 ENCOUNTER — Encounter: Payer: Self-pay | Admitting: Family Medicine

## 2018-12-28 DIAGNOSIS — H40023 Open angle with borderline findings, high risk, bilateral: Secondary | ICD-10-CM | POA: Diagnosis not present

## 2019-02-02 DIAGNOSIS — Z20828 Contact with and (suspected) exposure to other viral communicable diseases: Secondary | ICD-10-CM | POA: Diagnosis not present

## 2019-02-24 DIAGNOSIS — Z20828 Contact with and (suspected) exposure to other viral communicable diseases: Secondary | ICD-10-CM | POA: Diagnosis not present

## 2019-02-27 ENCOUNTER — Encounter: Payer: Self-pay | Admitting: Family Medicine

## 2019-02-28 ENCOUNTER — Other Ambulatory Visit: Payer: Self-pay

## 2019-02-28 DIAGNOSIS — Z20822 Contact with and (suspected) exposure to covid-19: Secondary | ICD-10-CM

## 2019-03-02 LAB — NOVEL CORONAVIRUS, NAA: SARS-CoV-2, NAA: NOT DETECTED

## 2019-03-03 DIAGNOSIS — Z20828 Contact with and (suspected) exposure to other viral communicable diseases: Secondary | ICD-10-CM | POA: Insufficient documentation

## 2019-03-03 DIAGNOSIS — Z20822 Contact with and (suspected) exposure to covid-19: Secondary | ICD-10-CM | POA: Insufficient documentation

## 2019-03-03 HISTORY — DX: Contact with and (suspected) exposure to covid-19: Z20.822

## 2019-03-03 NOTE — Progress Notes (Addendum)
Virtual Visit via Video   Due to the COVID-19 pandemic, this visit was completed with telemedicine (audio/video) technology to reduce patient and provider exposure as well as to preserve personal protective equipment.   I connected with Tanna Savoyrystal L Heffron by a video enabled telemedicine application and verified that I am speaking with the correct person using two identifiers. Location patient: Home Location provider: [facility], Office Persons participating in the virtual visit: Suellyn Carney CornersL Kimrey, Raylee Strehl, MD   I discussed the limitations of evaluation and management by telemedicine and the availability of in person appointments. The patient expressed understanding and agreed to proceed.  Care Team   Patient Care Team: Dianne DunAron, Nijee Heatwole M, MD as PCP - General (Family Medicine)  Subjective:   HPI:   Health maintenance follow up.  Health Maintenance  Topic Date Due  . TETANUS/TDAP  04/23/2003  . INFLUENZA VACCINE  10/22/2018  . PAP SMEAR-Modifier  09/29/2020  . HIV Screening  Completed   She is not sexually active.  Last pap smear was done by me on 09/29/17- negative.  GAD- prescribed zoloft for her but when I last saw her on 09/29/17, she stated that she never started it because she was feeling betted.  At that time, she denied any symptoms of anxiety or depression.  No flowsheet data found.   [facility] 03/06/2019 09/29/2017 09/26/2015  Decreased Interest 0 0 0  Down, Depressed, Hopeless 0 0 0  PHQ - 2 Score 0 0 0   Exposure to covid 19-   Patient sent the following message:  "Good morning Dr. Dayton MartesAron,  I hope that you are doing well and I truly have missed seeing you this year.  I was recently in contact with someone who tested positive for COVID and am in quarantine until 12/10, per the health department.  My COVID test results from 12/04 came back negative, however, since I am having symptoms, I was advised by our company nurse to reach out to you to get  documentation of when I am able to come back to work.  Here is a timeline and the symptoms associated:  11/30- A little tired, not exhausted but a different kind of tired 12/01-The same 12/03-Throat a little sore 12/04-A little warm (day of COVID test) 12/05-12/07 A cough on and off (non productive)  Please let me know what I need to do to proceed, if we need to schedule an eVisit.  Thank you and have a blessed day!  Best regards,  Cristal Deerrystal Berhane"  Since she did have new symptoms, I did advise that she get retested.  SARS- CoV-2, NAA done on 02/28/19 was negative.  She needs a note to return to work.     No fever, chills, cough, congestion, runny nose, shortness of breath, fatigue, body aches, sore throat, headache, nausea, vomiting, diarrhea, or new loss of taste or smell.     Review of Systems  Constitutional: Negative.  Negative for fever and malaise/fatigue.  HENT: Negative.  Negative for congestion and hearing loss.   Eyes: Negative.  Negative for blurred vision, discharge and redness.  Respiratory: Negative.  Negative for cough and shortness of breath.   Cardiovascular: Negative.  Negative for chest pain, palpitations and leg swelling.  Gastrointestinal: Negative for abdominal pain and heartburn.  Genitourinary: Negative.  Negative for dysuria.  Musculoskeletal: Negative.  Negative for falls.  Skin: Negative.  Negative for rash.  Neurological: Negative for loss of consciousness and headaches.  Endo/Heme/Allergies: Does not  bruise/bleed easily.  Psychiatric/Behavioral: Negative for depression.  All other systems reviewed and are negative.    Patient Active Problem List   Diagnosis Date Noted  . Exposure to COVID-19 virus 03/03/2019  . Well woman exam without gynecological exam 09/29/2017  . Anemia, iron deficiency 09/29/2016  . GERD (gastroesophageal reflux disease) 05/08/2015  . Panic attacks 12/27/2014  . Insomnia 12/27/2014  . Generalized anxiety disorder  09/13/2014  . Menorrhagia 09/11/2013    Social History   Tobacco Use  . Smoking status: Never Smoker  . Smokeless tobacco: Never Used  Substance Use Topics  . Alcohol use: No    Alcohol/week: 0.0 standard drinks   No current outpatient medications on file.  No Known Allergies  Objective:  Ht 5' 5.5" (1.664 m)   Wt 212 lb (96.2 kg)   LMP 03/02/2019   BMI 34.74 kg/m   VITALS: Per patient if applicable, see vitals. GENERAL: Alert, appears well and in no acute distress. HEENT: Atraumatic, conjunctiva clear, no obvious abnormalities on inspection of external nose and ears. NECK: Normal movements of the head and neck. CARDIOPULMONARY: No increased WOB. Speaking in clear sentences. I:E ratio WNL.  MS: Moves all visible extremities without noticeable abnormality. PSYCH: Pleasant and cooperative, well-groomed. Speech normal rate and rhythm. Affect is appropriate. Insight and judgement are appropriate. Attention is focused, linear, and appropriate.  NEURO: CN grossly intact. Oriented as arrived to appointment on time with no prompting. Moves both UE equally.  SKIN: No obvious lesions, wounds, erythema, or cyanosis noted on face or hands.  Depression screen [facility] 2/9 03/06/2019 09/29/2017 09/26/2015  Decreased Interest 0 0 0  Down, Depressed, Hopeless 0 0 0  PHQ - 2 Score 0 0 0     . COVID-19 Education: The signs and symptoms of COVID-19 were discussed with the patient and how to seek care for testing if needed. The importance of social distancing was discussed today. . Reviewed expectations re: course of current medical issues. . Discussed self-management of symptoms. . Outlined signs and symptoms indicating need for more acute intervention. . Patient verbalized understanding and all questions were answered. Marland Kitchen Health Maintenance issues including appropriate healthy diet, exercise, and smoking avoidance were discussed with patient. . See orders for this visit as documented in the  electronic medical record.  Ruthe Mannan, MD    Lab Results  Component Value Date   WBC 6.3 09/29/2017   HGB 11.4 (L) 09/29/2017   HCT 34.2 (L) 09/29/2017   PLT 322.0 09/29/2017   GLUCOSE 109 (H) 09/29/2017   CHOL 197 09/29/2017   TRIG 144.0 09/29/2017   HDL 43.40 09/29/2017   LDLDIRECT 149.6 08/20/2011   LDLCALC 124 (H) 09/29/2017   ALT 25 09/29/2017   AST 17 09/29/2017   NA 138 09/29/2017   K 3.9 09/29/2017   CL 105 09/29/2017   CREATININE 0.69 09/29/2017   BUN 10 09/29/2017   CO2 27 09/29/2017   TSH 1.46 09/29/2017   HGBA1C 6.2 09/11/2013    Lab Results  Component Value Date   TSH 1.46 09/29/2017   Lab Results  Component Value Date   WBC 6.3 09/29/2017   HGB 11.4 (L) 09/29/2017   HCT 34.2 (L) 09/29/2017   MCV 80.7 09/29/2017   PLT 322.0 09/29/2017   Lab Results  Component Value Date   NA 138 09/29/2017   K 3.9 09/29/2017   CO2 27 09/29/2017   GLUCOSE 109 (H) 09/29/2017   BUN 10 09/29/2017   CREATININE 0.69 09/29/2017  BILITOT 0.3 09/29/2017   ALKPHOS 81 09/29/2017   AST 17 09/29/2017   ALT 25 09/29/2017   PROT 7.3 09/29/2017   ALBUMIN 4.0 09/29/2017   CALCIUM 9.1 09/29/2017   ANIONGAP 5 02/23/2015   GFR 125.68 09/29/2017   Lab Results  Component Value Date   CHOL 197 09/29/2017   Lab Results  Component Value Date   HDL 43.40 09/29/2017   Lab Results  Component Value Date   LDLCALC 124 (H) 09/29/2017   Lab Results  Component Value Date   TRIG 144.0 09/29/2017   Lab Results  Component Value Date   CHOLHDL 5 09/29/2017   Lab Results  Component Value Date   HGBA1C 6.2 09/11/2013       Assessment & Plan:   Problem List Items Addressed This Visit      Active Problems   Menorrhagia   Generalized anxiety disorder    Denies feeling depressed or anxious without rx.      Anemia, iron deficiency    Repeat labs.       Relevant Orders   CBC with Differential/Platelet   Iron, TIBC and Ferritin Panel   Well woman exam without  gynecological exam    Reviewed preventive care protocols, scheduled due services, and updated immunizations Discussed nutrition, exercise, diet, and healthy lifestyle. She will come in for labs- scheduled for 12/21 per pt request. Orders Placed This Encounter  Procedures  . CBC with Differential/Platelet  . Iron, TIBC and Ferritin Panel  . DM- a1c  . Lipid panel  . Comprehensive metabolic panel  . TSH  . T4, free          Exposure to COVID-19 virus - Primary    Since has tested negative for SARS- CoV-2, NAA done on 12/4 and 02/28/19 both which were negative.    No fever, chills, cough, congestion, runny nose, shortness of breath, fatigue, body aches, sore throat, headache, nausea, vomiting, diarrhea, or new loss of taste or smell.    She needs a note to return to work.   I have written note and sent to patient via mychart.         Other Visit Diagnoses    Hyperglycemia       Relevant Orders   DM- a1c   Hyperlipidemia, unspecified hyperlipidemia type       Relevant Orders   Lipid panel   Comprehensive metabolic panel   TSH   T4, free   Goiter       Relevant Orders   TSH   T4, free      I have discontinued Yesly L. Bigos's Cetirizine HCl.  No orders of the defined types were placed in this encounter.    Arnette Norris, MD

## 2019-03-06 ENCOUNTER — Encounter: Payer: Self-pay | Admitting: Family Medicine

## 2019-03-06 ENCOUNTER — Telehealth (INDEPENDENT_AMBULATORY_CARE_PROVIDER_SITE_OTHER): Payer: BC Managed Care – PPO | Admitting: Family Medicine

## 2019-03-06 VITALS — Ht 65.5 in | Wt 212.0 lb

## 2019-03-06 DIAGNOSIS — N922 Excessive menstruation at puberty: Secondary | ICD-10-CM

## 2019-03-06 DIAGNOSIS — Z20828 Contact with and (suspected) exposure to other viral communicable diseases: Secondary | ICD-10-CM | POA: Diagnosis not present

## 2019-03-06 DIAGNOSIS — Z20822 Contact with and (suspected) exposure to covid-19: Secondary | ICD-10-CM

## 2019-03-06 DIAGNOSIS — E785 Hyperlipidemia, unspecified: Secondary | ICD-10-CM

## 2019-03-06 DIAGNOSIS — D509 Iron deficiency anemia, unspecified: Secondary | ICD-10-CM | POA: Diagnosis not present

## 2019-03-06 DIAGNOSIS — Z0000 Encounter for general adult medical examination without abnormal findings: Secondary | ICD-10-CM | POA: Diagnosis not present

## 2019-03-06 DIAGNOSIS — R739 Hyperglycemia, unspecified: Secondary | ICD-10-CM

## 2019-03-06 DIAGNOSIS — F411 Generalized anxiety disorder: Secondary | ICD-10-CM

## 2019-03-06 DIAGNOSIS — E049 Nontoxic goiter, unspecified: Secondary | ICD-10-CM

## 2019-03-06 NOTE — Assessment & Plan Note (Addendum)
Reviewed preventive care protocols, scheduled due services, and updated immunizations Discussed nutrition, exercise, diet, and healthy lifestyle. She will come in for labs- scheduled for 12/21 per pt request. Orders Placed This Encounter  Procedures  . CBC with Differential/Platelet  . Iron, TIBC and Ferritin Panel  . DM- a1c  . Lipid panel  . Comprehensive metabolic panel  . TSH  . T4, free

## 2019-03-06 NOTE — Assessment & Plan Note (Signed)
Since has tested negative for SARS- CoV-2, NAA done on 12/4 and 02/28/19 both which were negative.    No fever, chills, cough, congestion, runny nose, shortness of breath, fatigue, body aches, sore throat, headache, nausea, vomiting, diarrhea, or new loss of taste or smell.    She needs a note to return to work.   I have written note and sent to patient via mychart.

## 2019-03-06 NOTE — Assessment & Plan Note (Signed)
Repeat labs:

## 2019-03-06 NOTE — Assessment & Plan Note (Signed)
Denies feeling depressed or anxious without rx.

## 2019-03-08 NOTE — Addendum Note (Signed)
Addended by: Lucille Passy on: 03/08/2019 03:51 PM   Modules accepted: Level of Service

## 2019-03-13 ENCOUNTER — Other Ambulatory Visit (INDEPENDENT_AMBULATORY_CARE_PROVIDER_SITE_OTHER): Payer: BC Managed Care – PPO

## 2019-03-13 ENCOUNTER — Other Ambulatory Visit: Payer: Self-pay

## 2019-03-13 DIAGNOSIS — E049 Nontoxic goiter, unspecified: Secondary | ICD-10-CM

## 2019-03-13 DIAGNOSIS — E785 Hyperlipidemia, unspecified: Secondary | ICD-10-CM | POA: Diagnosis not present

## 2019-03-13 DIAGNOSIS — R739 Hyperglycemia, unspecified: Secondary | ICD-10-CM

## 2019-03-13 DIAGNOSIS — D509 Iron deficiency anemia, unspecified: Secondary | ICD-10-CM | POA: Diagnosis not present

## 2019-03-13 LAB — CBC WITH DIFFERENTIAL/PLATELET
Basophils Absolute: 0 10*3/uL (ref 0.0–0.1)
Basophils Relative: 0.6 % (ref 0.0–3.0)
Eosinophils Absolute: 0.1 10*3/uL (ref 0.0–0.7)
Eosinophils Relative: 2.1 % (ref 0.0–5.0)
HCT: 36.3 % (ref 36.0–46.0)
Hemoglobin: 11.7 g/dL — ABNORMAL LOW (ref 12.0–15.0)
Lymphocytes Relative: 37 % (ref 12.0–46.0)
Lymphs Abs: 2.2 10*3/uL (ref 0.7–4.0)
MCHC: 32.3 g/dL (ref 30.0–36.0)
MCV: 83.2 fl (ref 78.0–100.0)
Monocytes Absolute: 0.4 10*3/uL (ref 0.1–1.0)
Monocytes Relative: 6.9 % (ref 3.0–12.0)
Neutro Abs: 3.1 10*3/uL (ref 1.4–7.7)
Neutrophils Relative %: 53.4 % (ref 43.0–77.0)
Platelets: 301 10*3/uL (ref 150.0–400.0)
RBC: 4.36 Mil/uL (ref 3.87–5.11)
RDW: 14.7 % (ref 11.5–15.5)
WBC: 5.9 10*3/uL (ref 4.0–10.5)

## 2019-03-13 LAB — LIPID PANEL
Cholesterol: 246 mg/dL — ABNORMAL HIGH (ref 0–200)
HDL: 44.3 mg/dL (ref >39.00)
LDL Cholesterol: 179 mg/dL — ABNORMAL HIGH (ref 0–99)
NonHDL: 201.71
Total CHOL/HDL Ratio: 6
Triglycerides: 114 mg/dL (ref 0.0–149.0)
VLDL: 22.8 mg/dL (ref 0.0–40.0)

## 2019-03-13 LAB — T4, FREE: Free T4: 0.65 ng/dL (ref 0.60–1.60)

## 2019-03-13 LAB — COMPREHENSIVE METABOLIC PANEL
ALT: 26 U/L (ref 0–35)
AST: 18 U/L (ref 0–37)
Albumin: 4.2 g/dL (ref 3.5–5.2)
Alkaline Phosphatase: 78 U/L (ref 39–117)
BUN: 9 mg/dL (ref 6–23)
CO2: 24 mEq/L (ref 19–32)
Calcium: 9.2 mg/dL (ref 8.4–10.5)
Chloride: 106 mEq/L (ref 96–112)
Creatinine, Ser: 0.69 mg/dL (ref 0.40–1.20)
GFR: 117.23 mL/min (ref >60.00)
Glucose, Bld: 115 mg/dL — ABNORMAL HIGH (ref 70–99)
Potassium: 4 mEq/L (ref 3.5–5.1)
Sodium: 139 mEq/L (ref 135–145)
Total Bilirubin: 0.3 mg/dL (ref 0.2–1.2)
Total Protein: 7 g/dL (ref 6.0–8.3)

## 2019-03-13 LAB — TSH: TSH: 2.92 u[IU]/mL (ref 0.35–4.50)

## 2019-03-13 LAB — HEMOGLOBIN A1C: Hgb A1c MFr Bld: 6.3 % (ref 4.6–6.5)

## 2019-03-14 ENCOUNTER — Other Ambulatory Visit: Payer: Self-pay | Admitting: Family Medicine

## 2019-03-14 ENCOUNTER — Encounter: Payer: Self-pay | Admitting: Family Medicine

## 2019-03-14 LAB — IRON,TIBC AND FERRITIN PANEL
%SAT: 8 % (calc) — ABNORMAL LOW (ref 16–45)
Ferritin: 13 ng/mL — ABNORMAL LOW (ref 16–154)
Iron: 31 ug/dL — ABNORMAL LOW (ref 40–190)
TIBC: 386 mcg/dL (calc) (ref 250–450)

## 2019-03-14 MED ORDER — FERROUS SULFATE 325 (65 FE) MG PO TABS: 325.0000 mg | ORAL_TABLET | Freq: Every day | ORAL | 3 refills | Status: DC

## 2019-03-15 DIAGNOSIS — Z03818 Encounter for observation for suspected exposure to other biological agents ruled out: Secondary | ICD-10-CM | POA: Diagnosis not present

## 2019-03-15 DIAGNOSIS — J069 Acute upper respiratory infection, unspecified: Secondary | ICD-10-CM | POA: Diagnosis not present

## 2019-04-24 ENCOUNTER — Encounter: Payer: Self-pay | Admitting: Family Medicine

## 2019-05-31 ENCOUNTER — Other Ambulatory Visit: Payer: Self-pay

## 2019-05-31 ENCOUNTER — Encounter: Payer: Self-pay | Admitting: Primary Care

## 2019-05-31 ENCOUNTER — Ambulatory Visit: Payer: BC Managed Care – PPO | Admitting: Primary Care

## 2019-05-31 DIAGNOSIS — F411 Generalized anxiety disorder: Secondary | ICD-10-CM

## 2019-05-31 DIAGNOSIS — D509 Iron deficiency anemia, unspecified: Secondary | ICD-10-CM | POA: Diagnosis not present

## 2019-05-31 DIAGNOSIS — N922 Excessive menstruation at puberty: Secondary | ICD-10-CM

## 2019-05-31 DIAGNOSIS — R7303 Prediabetes: Secondary | ICD-10-CM | POA: Diagnosis not present

## 2019-05-31 DIAGNOSIS — E785 Hyperlipidemia, unspecified: Secondary | ICD-10-CM | POA: Diagnosis not present

## 2019-05-31 NOTE — Patient Instructions (Signed)
Start exercising. You should be getting 150 minutes of moderate intensity exercise weekly.  It's important to improve your diet by reducing consumption of fast food, fried food, processed snack foods, sugary drinks. Increase consumption of fresh vegetables and fruits, whole grains, water.  Ensure you are drinking 64 ounces of water daily.  Take the ferrous sulfate 325 mg once daily for anemia.  Schedule a lab only appointment for 4-6 weeks.  It was a pleasure meeting you!

## 2019-05-31 NOTE — Assessment & Plan Note (Signed)
Iron and ferritin low in December 2020, non compliant to daily ferrous sulfate.  Discussed to work on taking ferrous sulfate daily. Repeat CBC and Iron panels in 4-6 weeks.

## 2019-05-31 NOTE — Assessment & Plan Note (Signed)
Denies concerns for anxiety and depression 

## 2019-05-31 NOTE — Assessment & Plan Note (Signed)
A1C of 6.3 in December 2020. She is working on her diet, has lost 6 pounds since last visit, commended her on this.  Discussed her diet, encouraged regular exercise. Repeat A1C in 4-6 weeks.

## 2019-05-31 NOTE — Assessment & Plan Note (Signed)
Denies concerns for insomnia, anxiety.

## 2019-05-31 NOTE — Progress Notes (Signed)
Subjective:    Patient ID: Jillian Contreras, female    DOB: October 05, 1985, 34 y.o.   MRN: [id number]  HPI  This visit occurred during the SARS-CoV-2 public health emergency.  Safety protocols were in place, including screening questions prior to the visit, additional usage of staff PPE, and extensive cleaning of exam room while observing appropriate contact time as indicated for disinfecting solutions.   Jillian Contreras is a 34 year old female who presents today to transfer care from Dr. Dayton Martes.  1) Prediabetes: Last A1C of 6.3 in late December 2020. Currently not managed on medication.   She denies polyuria, polydipsia, polyphagia. She does noticed occasional numbness to the toes of her left foot.   Diet currently consists of:  Breakfast: Nuts, fruit, toast with apple butter, peanut butter crackers. Waffles on the weekends (made with bananas) Lunch: Fast food, salad, sandwich, fruit, nuts Dinner: Tacos, sometimes skips, smoothie, salads, steak, chicken, vegetables. Snacks: Popcorn Desserts: Halo Top Ice Cream, cake, brownies. 2-3 times weekly Beverages: Coffee with light cream, water, Sprite, grape juice  Exercise: She is not exercising. She works in a [employer], walks during her work day.   Wt Readings from Last 3 Encounters:  05/31/19 206 lb 12 oz (93.8 kg)  03/06/19 212 lb (96.2 kg)  09/29/17 215 lb 6.4 oz (97.7 kg)     2) Iron Deficiency Anemia: Chronic for several years.  Iron level of 31 with Ferritin of 13 in December 2020. She is not taking oral iron consistently, misses often. She does have heavy menstrual periods sometimes. She denies bloody stools.   She denies chest pain, shortness of breath.   3) Hyperlipidemia: Lipid panel in December 2020 with LDL of 179, HDL of 44.   BP Readings from Last 3 Encounters:  05/31/19 110/70  09/29/17 104/64  09/29/16 110/80   4) GAD/Panic Attacks: Chronic for years, was prescribed medication but never took. Overall she has no concerns for  anxiety.   Review of Systems  Eyes: Negative for visual disturbance.  Respiratory: Negative for shortness of breath.   Cardiovascular: Negative for chest pain.  Endocrine: Negative for polydipsia, polyphagia and polyuria.  Neurological: Positive for numbness. Negative for dizziness and headaches.       Past Medical History:  Diagnosis Date  . Anxiety   . Chest pain   . Hypokalemia   . Palpitations   . Palpitations   . Panic attack      Social History   Socioeconomic History  . Marital status: Single    Spouse name: Not on file  . Number of children: Not on file  . Years of education: Not on file  . Highest education level: Not on file  Occupational History  . Occupation: customer service    Comment: Administrator, [employer]  Tobacco Use  . Smoking status: Never Smoker  . Smokeless tobacco: Never Used  Substance and Sexual Activity  . Alcohol use: No    Alcohol/week: 0.0 standard drinks  . Drug use: No  . Sexual activity: Not on file  Other Topics Concern  . Not on file  Social History Narrative  . Not on file   Social Determinants of Health   Financial Resource Strain:   . Difficulty of Paying Living Expenses: Not on file  Food Insecurity:   . Worried About Programme researcher, broadcasting/film/video in the Last Year: Not on file  . Ran Out of Food in the Last Year: Not on file  Transportation Needs:   .  Lack of Transportation (Medical): Not on file  . Lack of Transportation (Non-Medical): Not on file  Physical Activity:   . Days of Exercise per Week: Not on file  . Minutes of Exercise per Session: Not on file  Stress:   . Feeling of Stress : Not on file  Social Connections:   . Frequency of Communication with Friends and Family: Not on file  . Frequency of Social Gatherings with Friends and Family: Not on file  . Attends Religious Services: Not on file  . Active Member of Clubs or Organizations: Not on file  . Attends Archivist Meetings: Not on file  . Marital Status:  Not on file  Intimate Partner Violence:   . Fear of Current or Ex-Partner: Not on file  . Emotionally Abused: Not on file  . Physically Abused: Not on file  . Sexually Abused: Not on file    Past Surgical History:  Procedure Laterality Date  . CESAREAN SECTION      Family History  Problem Relation Age of Onset  . Cancer Paternal Aunt 40       breast  . Breast cancer Paternal Aunt   . Breast cancer Paternal Grandmother   . Breast cancer Paternal Aunt   . Breast cancer Paternal Aunt     No Known Allergies  Current Outpatient Medications on File Prior to Visit  Medication Sig Dispense Refill  . ferrous sulfate 325 (65 FE) MG tablet Take 1 tablet (325 mg total) by mouth daily with breakfast. 30 tablet 3   No current facility-administered medications on file prior to visit.    BP 110/70   Pulse 60   Temp (!) 97.4 F (36.3 C) (Temporal)   Ht 5' 5.5" (1.664 m)   Wt 206 lb 12 oz (93.8 kg)   LMP 05/22/2019   SpO2 98%   BMI 33.88 kg/m    Objective:   Physical Exam  Constitutional: She appears well-nourished.  Cardiovascular: Normal rate and regular rhythm.  Respiratory: Effort normal and breath sounds normal.  Musculoskeletal:     Cervical back: Neck supple.  Skin: Skin is warm and dry.  Psychiatric: She has a normal mood and affect.           Assessment & Plan:

## 2019-05-31 NOTE — Assessment & Plan Note (Signed)
LDL of 179 in December 2020 which is suspect to be a combination of family history and obesity.  She will continue to work on weight loss through healthy diet, repeat lipids in 4-6 weeks.

## 2019-05-31 NOTE — Assessment & Plan Note (Signed)
Intermittent, no recent heavy menstrual cycle. She does have iron deficiency anemia.   Repeat CBC and Iron studies in 4-6 weeks.

## 2019-06-22 ENCOUNTER — Other Ambulatory Visit: Payer: Self-pay

## 2019-06-22 ENCOUNTER — Ambulatory Visit: Payer: BC Managed Care – PPO | Admitting: Primary Care

## 2019-06-22 VITALS — BP 122/72 | HR 72 | Temp 97.4°F | Ht 65.5 in | Wt 208.5 lb

## 2019-06-22 DIAGNOSIS — R1012 Left upper quadrant pain: Secondary | ICD-10-CM | POA: Insufficient documentation

## 2019-06-22 HISTORY — DX: Left upper quadrant pain: R10.12

## 2019-06-22 LAB — POC URINALSYSI DIPSTICK (AUTOMATED)
Bilirubin, UA: NEGATIVE
Glucose, UA: NEGATIVE
Ketones, UA: NEGATIVE
Leukocytes, UA: NEGATIVE
Nitrite, UA: NEGATIVE
Protein, UA: NEGATIVE
Spec Grav, UA: 1.025 (ref 1.010–1.025)
Urobilinogen, UA: 0.2 E.U./dL
pH, UA: 6 (ref 5.0–8.0)

## 2019-06-22 NOTE — Progress Notes (Signed)
Subjective:    Patient ID: Jillian Contreras, female    DOB: 1986/03/15, 33 y.o.   MRN: [id number]  HPI  This visit occurred during the SARS-CoV-2 public health emergency.  Safety protocols were in place, including screening questions prior to the visit, additional usage of staff PPE, and extensive cleaning of exam room while observing appropriate contact time as indicated for disinfecting solutions.   Jillian Contreras is a 34 year old female with a history of GERD, anxiety disorder, iron deficiency anemia, prediabetes, hyperlipidemia who presents today with a chief complaint of abdominal pain.  Her pain is located to the left upper quadrant of her abdomen, left upper lateal rib cage, very far left posterior thoracic back. Her pain is intermittent, non radiating, feels like a sharp/stabbing, burning pain. Also with "gas" sensation. She believes she's noticed a "knot" to the left upper abdomen. Symptoms began about one year ago, maybe more frequent.  She denies pain with eating or movement, nausea/vomiting, constipation, injury/trauma, urinary symptoms, rashes, frequent esophageal burning/reflux symptoms. She's tried taking Pepto Bismol with some improvement.   Review of Systems  Gastrointestinal: Positive for abdominal pain. Negative for constipation, diarrhea, nausea and vomiting.  Skin: Negative for rash.  Neurological: Negative for numbness.       Past Medical History:  Diagnosis Date  . Anxiety   . Chest pain   . Hypokalemia   . Palpitations   . Palpitations   . Panic attack   . Panic attacks 12/27/2014     Social History   Socioeconomic History  . Marital status: Single    Spouse name: Not on file  . Number of children: Not on file  . Years of education: Not on file  . Highest education level: Not on file  Occupational History  . Occupation: customer service    Comment: Industrial/product designer  Tobacco Use  . Smoking status: Never Smoker  . Smokeless tobacco: Never Used    Substance and Sexual Activity  . Alcohol use: No    Alcohol/week: 0.0 standard drinks  . Drug use: No  . Sexual activity: Not on file  Other Topics Concern  . Not on file  Social History Narrative  . Not on file   Social Determinants of Health   Financial Resource Strain:   . Difficulty of Paying Living Expenses:   Food Insecurity:   . Worried About Charity fundraiser in the Last Year:   . Arboriculturist in the Last Year:   Transportation Needs:   . Film/video editor (Medical):   Marland Kitchen Lack of Transportation (Non-Medical):   Physical Activity:   . Days of Exercise per Week:   . Minutes of Exercise per Session:   Stress:   . Feeling of Stress :   Social Connections:   . Frequency of Communication with Friends and Family:   . Frequency of Social Gatherings with Friends and Family:   . Attends Religious Services:   . Active Member of Clubs or Organizations:   . Attends Archivist Meetings:   Marland Kitchen Marital Status:   Intimate Partner Violence:   . Fear of Current or Ex-Partner:   . Emotionally Abused:   Marland Kitchen Physically Abused:   . Sexually Abused:     Past Surgical History:  Procedure Laterality Date  . CESAREAN SECTION      Family History  Problem Relation Age of Onset  . Cancer Paternal Aunt 65       breast  .  Breast cancer Paternal Aunt   . Breast cancer Paternal Grandmother   . Breast cancer Paternal Aunt   . Breast cancer Paternal Aunt     No Known Allergies  Current Outpatient Medications on File Prior to Visit  Medication Sig Dispense Refill  . ferrous sulfate 325 (65 FE) MG tablet Take 1 tablet (325 mg total) by mouth daily with breakfast. 30 tablet 3   No current facility-administered medications on file prior to visit.    BP 122/72   Pulse 72   Temp (!) 97.4 F (36.3 C) (Temporal)   Ht 5' 5.5" (1.664 m)   Wt 208 lb 8 oz (94.6 kg)   LMP 06/18/2019   SpO2 97%   BMI 34.17 kg/m    Objective:   Physical Exam  Constitutional: She  appears well-nourished.  Cardiovascular: Normal rate.  Respiratory: Effort normal and breath sounds normal.  GI: Soft. Bowel sounds are normal. There is no abdominal tenderness.  Musculoskeletal:     Cervical back: Neck supple.  Skin: Skin is warm and dry.           Assessment & Plan:

## 2019-06-22 NOTE — Assessment & Plan Note (Addendum)
Chronic for one year, intermittent. Also with same pain to left upper/lateral rib cage.  Exam today without evidence of shingles, acute illness. UA today negative. She does have 3+ leuks but she is on her menstrual cycle.  MSK etiology is a possibility, although I was unable to provoke symptoms today. Check H pylori stool test given location of abdominal pain and improvement with Pepto Bismol.  Labs reviewed from December 2020, due for repeat iron/A1C/lipid check in a few weeks.  Await H pylori test. She is not on PPI therapy.

## 2019-06-22 NOTE — Patient Instructions (Signed)
Pick up the stool kit from the lab. Return the kit whenever possible.  It was a pleasure to see you today!

## 2019-06-23 LAB — HELICOBACTER PYLORI  SPECIAL ANTIGEN
MICRO NUMBER:: 10318001
SPECIMEN QUALITY: ADEQUATE

## 2019-06-29 ENCOUNTER — Other Ambulatory Visit: Payer: Self-pay | Admitting: Primary Care

## 2019-06-29 DIAGNOSIS — D509 Iron deficiency anemia, unspecified: Secondary | ICD-10-CM

## 2019-06-29 DIAGNOSIS — R7303 Prediabetes: Secondary | ICD-10-CM

## 2019-06-29 DIAGNOSIS — E785 Hyperlipidemia, unspecified: Secondary | ICD-10-CM

## 2019-07-12 ENCOUNTER — Other Ambulatory Visit (INDEPENDENT_AMBULATORY_CARE_PROVIDER_SITE_OTHER): Payer: BC Managed Care – PPO

## 2019-07-12 ENCOUNTER — Other Ambulatory Visit: Payer: Self-pay

## 2019-07-12 DIAGNOSIS — R7303 Prediabetes: Secondary | ICD-10-CM

## 2019-07-12 DIAGNOSIS — E785 Hyperlipidemia, unspecified: Secondary | ICD-10-CM | POA: Diagnosis not present

## 2019-07-12 DIAGNOSIS — D509 Iron deficiency anemia, unspecified: Secondary | ICD-10-CM | POA: Diagnosis not present

## 2019-07-12 LAB — CBC
HCT: 36.9 % (ref 36.0–46.0)
Hemoglobin: 12.3 g/dL (ref 12.0–15.0)
MCHC: 33.3 g/dL (ref 30.0–36.0)
MCV: 84.5 fl (ref 78.0–100.0)
Platelets: 292 10*3/uL (ref 150.0–400.0)
RBC: 4.36 Mil/uL (ref 3.87–5.11)
RDW: 14.8 % (ref 11.5–15.5)
WBC: 5.5 10*3/uL (ref 4.0–10.5)

## 2019-07-12 LAB — LIPID PANEL
Cholesterol: 195 mg/dL (ref 0–200)
HDL: 45.9 mg/dL (ref >39.00)
LDL Cholesterol: 131 mg/dL — ABNORMAL HIGH (ref 0–99)
NonHDL: 148.99
Total CHOL/HDL Ratio: 4
Triglycerides: 89 mg/dL (ref 0.0–149.0)
VLDL: 17.8 mg/dL (ref 0.0–40.0)

## 2019-07-12 LAB — IBC + FERRITIN
Ferritin: 11.1 ng/mL (ref 10.0–291.0)
Iron: 67 ug/dL (ref 42–145)
Saturation Ratios: 15.1 % — ABNORMAL LOW (ref 20.0–50.0)
Transferrin: 316 mg/dL (ref 212.0–360.0)

## 2019-07-12 LAB — HEMOGLOBIN A1C: Hgb A1c MFr Bld: 6.1 % (ref 4.6–6.5)

## 2019-09-25 DIAGNOSIS — Z20822 Contact with and (suspected) exposure to covid-19: Secondary | ICD-10-CM | POA: Diagnosis not present

## 2019-09-26 ENCOUNTER — Other Ambulatory Visit: Payer: Self-pay

## 2019-09-26 ENCOUNTER — Telehealth (INDEPENDENT_AMBULATORY_CARE_PROVIDER_SITE_OTHER): Payer: BC Managed Care – PPO | Admitting: Primary Care

## 2019-09-26 ENCOUNTER — Encounter: Payer: Self-pay | Admitting: Primary Care

## 2019-09-26 DIAGNOSIS — Z20822 Contact with and (suspected) exposure to covid-19: Secondary | ICD-10-CM | POA: Diagnosis not present

## 2019-09-26 NOTE — Progress Notes (Signed)
Subjective:    Patient ID: Jillian Contreras, female    DOB: 09-13-1985, 34 y.o.   MRN: [id number]  HPI  Virtual Visit via Video Note  I connected with Jillian Contreras on 09/26/19 at 10:20 AM EDT by a video enabled telemedicine application and verified that I am speaking with the correct person using two identifiers.  Location: Patient: Home Provider: Office  Both patient and I participated on video visit.   I discussed the limitations of evaluation and management by telemedicine and the availability of in person appointments. The patient expressed understanding and agreed to proceed.  History of Present Illness:  Jillian Contreras is a 34 year old female with a history of GERD, prediabetes, hyperlipidemia who presents today due to Covid-19 exposure.  She was exposed to Covid-19 on July 3rd by her four year old cousin. She found out on July 4th that her cousin tested positive for Covid-19. Since then she's quarantined at home except for yesterday when she was Covid tested. She has yet to learn of the results which should be back tomorrow.  She denies symptoms. She is needing a work note for her employer.    Observations/Objective:  Alert and oriented. Appears well, not sickly. No distress. Speaking in complete sentences.   Assessment and Plan:  Exposed to Covid-19 three days ago, no symptoms. Covid-19 test pending.  Will have her quarantine until we have results. If she is positive then she'll need to remain in quarantine through July 17th, and if negative then she may end quarantine. She will update regarding her results. Work note provided.     Follow Up Instructions:  Please update me regarding your Covid-19 test.  Remain in quarantine until we find out that your test is negative, or through July 17th if your test is positive.  It was a pleasure to see you today! Jillian Reel, NP-C    I discussed the assessment and treatment plan with the patient. The patient was provided  an opportunity to ask questions and all were answered. The patient agreed with the plan and demonstrated an understanding of the instructions.   The patient was advised to call back or seek an in-person evaluation if the symptoms worsen or if the condition fails to improve as anticipated.    Doreene Nest, NP    Review of Systems  Constitutional: Negative for chills, fatigue and fever.  Respiratory: Negative for cough and shortness of breath.   Musculoskeletal: Negative for myalgias.       Past Medical History:  Diagnosis Date  . Anxiety   . Chest pain   . Hypokalemia   . Palpitations   . Palpitations   . Panic attack   . Panic attacks 12/27/2014     Social History   Socioeconomic History  . Marital status: Single    Spouse name: Not on file  . Number of children: Not on file  . Years of education: Not on file  . Highest education level: Not on file  Occupational History  . Occupation: customer service    Comment: Administrator, [employer]  Tobacco Use  . Smoking status: Never Smoker  . Smokeless tobacco: Never Used  Substance and Sexual Activity  . Alcohol use: No    Alcohol/week: 0.0 standard drinks  . Drug use: No  . Sexual activity: Not on file  Other Topics Concern  . Not on file  Social History Narrative  . Not on file   Social Determinants of Health  Financial Resource Strain:   . Difficulty of Paying Living Expenses:   Food Insecurity:   . Worried About Programme researcher, broadcasting/film/video in the Last Year:   . Barista in the Last Year:   Transportation Needs:   . Freight forwarder (Medical):   Marland Kitchen Lack of Transportation (Non-Medical):   Physical Activity:   . Days of Exercise per Week:   . Minutes of Exercise per Session:   Stress:   . Feeling of Stress :   Social Connections:   . Frequency of Communication with Friends and Family:   . Frequency of Social Gatherings with Friends and Family:   . Attends Religious Services:   . Active Member of  Clubs or Organizations:   . Attends Banker Meetings:   Marland Kitchen Marital Status:   Intimate Partner Violence:   . Fear of Current or Ex-Partner:   . Emotionally Abused:   Marland Kitchen Physically Abused:   . Sexually Abused:     Past Surgical History:  Procedure Laterality Date  . CESAREAN SECTION      Family History  Problem Relation Age of Onset  . Cancer Paternal Aunt 50       breast  . Breast cancer Paternal Aunt   . Breast cancer Paternal Grandmother   . Breast cancer Paternal Aunt   . Breast cancer Paternal Aunt     No Known Allergies  Current Outpatient Medications on File Prior to Visit  Medication Sig Dispense Refill  . ferrous sulfate 325 (65 FE) MG tablet Take 1 tablet (325 mg total) by mouth daily with breakfast. 30 tablet 3   No current facility-administered medications on file prior to visit.    LMP 09/10/2019    Objective:   Physical Exam Constitutional:      Appearance: She is not ill-appearing.  Pulmonary:     Effort: Pulmonary effort is normal.     Comments: No cough  Neurological:     Mental Status: She is alert and oriented to person, place, and time.            Assessment & Plan:

## 2019-09-26 NOTE — Assessment & Plan Note (Signed)
Exposed to Covid-19 three days ago, no symptoms. Covid-19 test pending.  Will have her quarantine until we have results. If she is positive then she'll need to remain in quarantine through July 17th, and if negative then she may end quarantine. She will update regarding her results. Work note provided.

## 2019-09-26 NOTE — Patient Instructions (Signed)
Please update me regarding your Covid-19 test.  Remain in quarantine until we find out that your test is negative, or through July 17th if your test is positive.  It was a pleasure to see you today! Mayra Reel, NP-C

## 2019-09-28 DIAGNOSIS — Z20822 Contact with and (suspected) exposure to covid-19: Secondary | ICD-10-CM | POA: Diagnosis not present

## 2020-01-15 ENCOUNTER — Telehealth: Payer: Self-pay

## 2020-01-15 ENCOUNTER — Encounter: Payer: Self-pay | Admitting: Primary Care

## 2020-01-15 ENCOUNTER — Ambulatory Visit: Payer: BC Managed Care – PPO | Admitting: Primary Care

## 2020-01-15 ENCOUNTER — Other Ambulatory Visit: Payer: Self-pay

## 2020-01-15 VITALS — BP 120/72 | HR 72 | Temp 97.6°F | Ht 65.5 in | Wt 216.0 lb

## 2020-01-15 DIAGNOSIS — E785 Hyperlipidemia, unspecified: Secondary | ICD-10-CM

## 2020-01-15 DIAGNOSIS — N644 Mastodynia: Secondary | ICD-10-CM

## 2020-01-15 DIAGNOSIS — R7303 Prediabetes: Secondary | ICD-10-CM | POA: Diagnosis not present

## 2020-01-15 LAB — LIPID PANEL
Cholesterol: 203 mg/dL — ABNORMAL HIGH (ref 0–200)
HDL: 42.6 mg/dL (ref >39.00)
LDL Cholesterol: 141 mg/dL — ABNORMAL HIGH (ref 0–99)
NonHDL: 160.81
Total CHOL/HDL Ratio: 5
Triglycerides: 101 mg/dL (ref 0.0–149.0)
VLDL: 20.2 mg/dL (ref 0.0–40.0)

## 2020-01-15 LAB — HEMOGLOBIN A1C: Hgb A1c MFr Bld: 6.4 % (ref 4.6–6.5)

## 2020-01-15 NOTE — Telephone Encounter (Signed)
[facility] Night - Client Nonclinical Telephone Record AccessNurse Client [facility] Night - Client Client Site  [facility] - Night Contact Type Call Who Is Calling Patient / Member / Family / Caregiver Caller Name [name] Caller Phone Number [phone] Patient Name Jillian Contreras Patient DOB 01/07/1986 Call Type Message Only Information Provided Reason for Call Request for General Office Information Initial Comment Caller states she's running a little late for her 740 appointment, should be there around 745 AM. Disp. Time Disposition Final User 01/15/2020 7:39:26 AM General Information Provided Yes [name] Call Closed By: [name] Transaction Date/Time: 01/15/2020 7:37:40 AM (ET)

## 2020-01-15 NOTE — Telephone Encounter (Signed)
Per chart review pt has already been seen today.

## 2020-01-15 NOTE — Assessment & Plan Note (Signed)
Repeat lipid panel pending. 

## 2020-01-15 NOTE — Assessment & Plan Note (Signed)
Acute for the last 2-3 months. Exam today overall unremarkable.  Orders placed for diagnostic mammogram with bilateral breast ultrasounds.

## 2020-01-15 NOTE — Progress Notes (Signed)
Subjective:    Patient ID: Jillian Contreras, female    DOB: December 16, 1985, 34 y.o.   MRN: [id number]  HPI  This visit occurred during the SARS-CoV-2 public health emergency.  Safety protocols were in place, including screening questions prior to the visit, additional usage of staff PPE, and extensive cleaning of exam room while observing appropriate contact time as indicated for disinfecting solutions.   Jillian Contreras is a 34 year old female with a history of menorrhagia, iron deficiency anemia, prediabetes, hyperlipidemia who presents today with a chief complaint of breast pain. She is also due for repeat labs including Lipids and A1C.  Her pain is located to the left lateral breast, left lower part of breast under her nipple, and sometimes to the medial left breast. Her pain occurs with and without menses which began 2-3 months ago.  She denies changes in skin, color. She does notice slight increase in size during menses. She denies the possibility that she is pregnant. LMP was in October 2021 and are typically once monthly.   She has a family history of breast cancer in paternal aunts, paternal grandmother, maternal cousin.   BP Readings from Last 3 Encounters:  01/15/20 120/72  06/22/19 122/72  05/31/19 110/70     Review of Systems  Eyes: Negative for visual disturbance.  Respiratory: Negative for shortness of breath.   Cardiovascular: Negative for chest pain.  Skin: Negative for color change.  Neurological: Negative for dizziness and headaches.       Past Medical History:  Diagnosis Date  . Anxiety   . Chest pain   . Hypokalemia   . Palpitations   . Palpitations   . Panic attack   . Panic attacks 12/27/2014     Social History   Socioeconomic History  . Marital status: Single    Spouse name: Not on file  . Number of children: Not on file  . Years of education: Not on file  . Highest education level: Not on file  Occupational History  . Occupation: customer service     Comment: Administrator, [employer]  Tobacco Use  . Smoking status: Never Smoker  . Smokeless tobacco: Never Used  Substance and Sexual Activity  . Alcohol use: No    Alcohol/week: 0.0 standard drinks  . Drug use: No  . Sexual activity: Not on file  Other Topics Concern  . Not on file  Social History Narrative  . Not on file   Social Determinants of Health   Financial Resource Strain:   . Difficulty of Paying Living Expenses: Not on file  Food Insecurity:   . Worried About Programme researcher, broadcasting/film/video in the Last Year: Not on file  . Ran Out of Food in the Last Year: Not on file  Transportation Needs:   . Lack of Transportation (Medical): Not on file  . Lack of Transportation (Non-Medical): Not on file  Physical Activity:   . Days of Exercise per Week: Not on file  . Minutes of Exercise per Session: Not on file  Stress:   . Feeling of Stress : Not on file  Social Connections:   . Frequency of Communication with Friends and Family: Not on file  . Frequency of Social Gatherings with Friends and Family: Not on file  . Attends Religious Services: Not on file  . Active Member of Clubs or Organizations: Not on file  . Attends Banker Meetings: Not on file  . Marital Status: Not on file  Intimate  Partner Violence:   . Fear of Current or Ex-Partner: Not on file  . Emotionally Abused: Not on file  . Physically Abused: Not on file  . Sexually Abused: Not on file    Past Surgical History:  Procedure Laterality Date  . CESAREAN SECTION      Family History  Problem Relation Age of Onset  . Cancer Paternal Aunt 48       breast  . Breast cancer Paternal Aunt   . Breast cancer Paternal Grandmother   . Breast cancer Paternal Aunt   . Breast cancer Paternal Aunt     No Known Allergies  No current outpatient medications on file prior to visit.   No current facility-administered medications on file prior to visit.    BP 120/72   Pulse 72   Temp 97.6 F (36.4 C) (Temporal)    Ht 5' 5.5" (1.664 m)   Wt 216 lb (98 kg)   SpO2 97%   BMI 35.40 kg/m    Objective:   Physical Exam Cardiovascular:     Rate and Rhythm: Normal rate and regular rhythm.  Pulmonary:     Effort: Pulmonary effort is normal.     Breath sounds: Normal breath sounds.  Chest:     Breasts:        Right: No swelling, mass, skin change or tenderness.        Left: No swelling, mass, skin change or tenderness.       Comments: Non tender on exam. Musculoskeletal:     Cervical back: Neck supple.  Skin:    General: Skin is warm and dry.            Assessment & Plan:

## 2020-01-15 NOTE — Assessment & Plan Note (Signed)
Repeat A1C pending.  Discussed the importance of a healthy diet and regular exercise in order for weight loss, and to reduce the risk of any potential medical problems.  

## 2020-01-15 NOTE — Patient Instructions (Addendum)
You will be contacted regarding the mammogram and ultrasound.  Please let us know if you have not been contacted within two weeks.   Stop by the lab prior to leaving today. I will notify you of your results once received.   It was a pleasure to see you today!

## 2020-01-25 ENCOUNTER — Ambulatory Visit
Admission: RE | Admit: 2020-01-25 | Discharge: 2020-01-25 | Disposition: A | Discharge status: Home / Self Care | Payer: BC Managed Care – PPO | Source: Ambulatory Visit | Attending: Primary Care | Admitting: Primary Care

## 2020-01-25 ENCOUNTER — Other Ambulatory Visit: Payer: Self-pay

## 2020-01-25 DIAGNOSIS — N644 Mastodynia: Secondary | ICD-10-CM | POA: Diagnosis not present

## 2020-02-02 DIAGNOSIS — Z20822 Contact with and (suspected) exposure to covid-19: Secondary | ICD-10-CM | POA: Diagnosis not present

## 2020-02-11 ENCOUNTER — Other Ambulatory Visit: Payer: Self-pay | Admitting: Primary Care

## 2020-02-11 DIAGNOSIS — D509 Iron deficiency anemia, unspecified: Secondary | ICD-10-CM

## 2020-02-11 DIAGNOSIS — E785 Hyperlipidemia, unspecified: Secondary | ICD-10-CM

## 2020-02-13 ENCOUNTER — Other Ambulatory Visit (INDEPENDENT_AMBULATORY_CARE_PROVIDER_SITE_OTHER): Payer: BC Managed Care – PPO

## 2020-02-13 ENCOUNTER — Other Ambulatory Visit: Payer: Self-pay

## 2020-02-13 DIAGNOSIS — D509 Iron deficiency anemia, unspecified: Secondary | ICD-10-CM | POA: Diagnosis not present

## 2020-02-13 DIAGNOSIS — E785 Hyperlipidemia, unspecified: Secondary | ICD-10-CM

## 2020-02-13 LAB — IBC + FERRITIN
Ferritin: 10.2 ng/mL (ref 10.0–291.0)
Iron: 105 ug/dL (ref 42–145)
Saturation Ratios: 24.1 % (ref 20.0–50.0)
Transferrin: 311 mg/dL (ref 212.0–360.0)

## 2020-02-13 LAB — COMPREHENSIVE METABOLIC PANEL
ALT: 19 U/L (ref 0–35)
AST: 16 U/L (ref 0–37)
Albumin: 4.1 g/dL (ref 3.5–5.2)
Alkaline Phosphatase: 56 U/L (ref 39–117)
BUN: 9 mg/dL (ref 6–23)
CO2: 25 mEq/L (ref 19–32)
Calcium: 9.1 mg/dL (ref 8.4–10.5)
Chloride: 105 mEq/L (ref 96–112)
Creatinine, Ser: 0.79 mg/dL (ref 0.40–1.20)
GFR: 96.65 mL/min (ref >60.00)
Glucose, Bld: 97 mg/dL (ref 70–99)
Potassium: 3.9 mEq/L (ref 3.5–5.1)
Sodium: 136 mEq/L (ref 135–145)
Total Bilirubin: 0.5 mg/dL (ref 0.2–1.2)
Total Protein: 7.2 g/dL (ref 6.0–8.3)

## 2020-02-13 LAB — CBC
HCT: 36.5 % (ref 36.0–46.0)
Hemoglobin: 12.1 g/dL (ref 12.0–15.0)
MCHC: 33.2 g/dL (ref 30.0–36.0)
MCV: 82.9 fl (ref 78.0–100.0)
Platelets: 274 10*3/uL (ref 150.0–400.0)
RBC: 4.4 Mil/uL (ref 3.87–5.11)
RDW: 13.8 % (ref 11.5–15.5)
WBC: 5.3 10*3/uL (ref 4.0–10.5)

## 2020-02-13 LAB — LIPID PANEL
Cholesterol: 182 mg/dL (ref 0–200)
HDL: 42.3 mg/dL (ref >39.00)
LDL Cholesterol: 119 mg/dL — ABNORMAL HIGH (ref 0–99)
NonHDL: 139.4
Total CHOL/HDL Ratio: 4
Triglycerides: 102 mg/dL (ref 0.0–149.0)
VLDL: 20.4 mg/dL (ref 0.0–40.0)

## 2020-02-22 ENCOUNTER — Encounter: Payer: Self-pay | Admitting: Primary Care

## 2020-02-22 ENCOUNTER — Other Ambulatory Visit: Payer: Self-pay

## 2020-02-22 ENCOUNTER — Ambulatory Visit (INDEPENDENT_AMBULATORY_CARE_PROVIDER_SITE_OTHER): Payer: BC Managed Care – PPO | Admitting: Primary Care

## 2020-02-22 VITALS — BP 110/62 | HR 72 | Temp 97.3°F | Ht 65.5 in | Wt 210.0 lb

## 2020-02-22 DIAGNOSIS — R7303 Prediabetes: Secondary | ICD-10-CM

## 2020-02-22 DIAGNOSIS — K219 Gastro-esophageal reflux disease without esophagitis: Secondary | ICD-10-CM

## 2020-02-22 DIAGNOSIS — E785 Hyperlipidemia, unspecified: Secondary | ICD-10-CM

## 2020-02-22 DIAGNOSIS — N644 Mastodynia: Secondary | ICD-10-CM | POA: Diagnosis not present

## 2020-02-22 DIAGNOSIS — Z0000 Encounter for general adult medical examination without abnormal findings: Secondary | ICD-10-CM

## 2020-02-22 DIAGNOSIS — N922 Excessive menstruation at puberty: Secondary | ICD-10-CM

## 2020-02-22 DIAGNOSIS — D509 Iron deficiency anemia, unspecified: Secondary | ICD-10-CM

## 2020-02-22 NOTE — Assessment & Plan Note (Addendum)
Improved compared to prior years. Commended her on healthy diet and exercise.   Repeat in January 2022.

## 2020-02-22 NOTE — Assessment & Plan Note (Signed)
No evidence of this on recent labs. Menorrhagia has improved.   Continue to monitor.

## 2020-02-22 NOTE — Progress Notes (Signed)
Subjective:    Patient ID: Jillian Contreras, female    DOB: February 22, 1986, 34 y.o.   MRN: [id number]  HPI  This visit occurred during the SARS-CoV-2 public health emergency.  Safety protocols were in place, including screening questions prior to the visit, additional usage of staff PPE, and extensive cleaning of exam room while observing appropriate contact time as indicated for disinfecting solutions.   Jillian Contreras is a 34 year old female who presents today for complete physical.  Immunizations: -Tetanus: Completed in 2004, due.  -Influenza: Declines -Covid-19: Completed first dose.  Diet: She endorses a healthy diet. Exercise: She is exercising 3 days weekly.  Eye exam: Completed this year. Dental exam: Due, she will schedule.   Pap Smear: Completed in 2019  BP Readings from Last 3 Encounters:  02/22/20 110/62  01/15/20 120/72  06/22/19 122/72   Wt Readings from Last 3 Encounters:  02/22/20 210 lb (95.3 kg)  01/15/20 216 lb (98 kg)  06/22/19 208 lb 8 oz (94.6 kg)     Review of Systems  Constitutional: Negative for unexpected weight change.  HENT: Negative for rhinorrhea.   Eyes: Negative for visual disturbance.  Respiratory: Negative for cough and shortness of breath.   Cardiovascular: Negative for chest pain.  Gastrointestinal: Negative for constipation and diarrhea.  Genitourinary: Negative for difficulty urinating and menstrual problem.  Musculoskeletal: Negative for arthralgias and myalgias.  Skin: Negative for rash.  Allergic/Immunologic: Negative for environmental allergies.  Neurological: Positive for headaches.       Headaches with menses   Hematological: Negative for adenopathy.  Psychiatric/Behavioral: The patient is not nervous/anxious.        Past Medical History:  Diagnosis Date  . Anxiety   . Chest pain   . Hypokalemia   . Palpitations   . Palpitations   . Panic attack   . Panic attacks 12/27/2014     Social History   Socioeconomic History    . Marital status: Single    Spouse name: Not on file  . Number of children: Not on file  . Years of education: Not on file  . Highest education level: Not on file  Occupational History  . Occupation: customer service    Comment: Administrator, [employer]  Tobacco Use  . Smoking status: Never Smoker  . Smokeless tobacco: Never Used  Substance and Sexual Activity  . Alcohol use: No    Alcohol/week: 0.0 standard drinks  . Drug use: No  . Sexual activity: Not on file  Other Topics Concern  . Not on file  Social History Narrative  . Not on file   Social Determinants of Health   Financial Resource Strain:   . Difficulty of Paying Living Expenses: Not on file  Food Insecurity:   . Worried About Programme researcher, broadcasting/film/video in the Last Year: Not on file  . Ran Out of Food in the Last Year: Not on file  Transportation Needs:   . Lack of Transportation (Medical): Not on file  . Lack of Transportation (Non-Medical): Not on file  Physical Activity:   . Days of Exercise per Week: Not on file  . Minutes of Exercise per Session: Not on file  Stress:   . Feeling of Stress : Not on file  Social Connections:   . Frequency of Communication with Friends and Family: Not on file  . Frequency of Social Gatherings with Friends and Family: Not on file  . Attends Religious Services: Not on file  . Active Member  of Clubs or Organizations: Not on file  . Attends Banker Meetings: Not on file  . Marital Status: Not on file  Intimate Partner Violence:   . Fear of Current or Ex-Partner: Not on file  . Emotionally Abused: Not on file  . Physically Abused: Not on file  . Sexually Abused: Not on file    Past Surgical History:  Procedure Laterality Date  . CESAREAN SECTION      Family History  Problem Relation Age of Onset  . Cancer Paternal Aunt 45       breast  . Breast cancer Paternal Aunt   . Breast cancer Paternal Grandmother   . Breast cancer Paternal Aunt   . Breast cancer Paternal  Aunt     No Known Allergies  No current outpatient medications on file prior to visit.   No current facility-administered medications on file prior to visit.    BP 110/62   Pulse 72   Temp (!) 97.3 F (36.3 C) (Temporal)   Ht 5' 5.5" (1.664 m)   Wt 210 lb (95.3 kg)   LMP 02/18/2020 (Approximate)   SpO2 96%   BMI 34.41 kg/m    Objective:   Physical Exam HENT:     Right Ear: Tympanic membrane and ear canal normal.     Left Ear: Tympanic membrane and ear canal normal.  Eyes:     Pupils: Pupils are equal, round, and reactive to light.  Cardiovascular:     Rate and Rhythm: Normal rate and regular rhythm.  Pulmonary:     Effort: Pulmonary effort is normal.     Breath sounds: Normal breath sounds.  Abdominal:     General: Bowel sounds are normal.     Palpations: Abdomen is soft.     Tenderness: There is no abdominal tenderness.  Musculoskeletal:        General: Normal range of motion.     Cervical back: Neck supple.  Skin:    General: Skin is warm and dry.  Neurological:     Mental Status: She is alert and oriented to person, place, and time.     Cranial Nerves: No cranial nerve deficit.     Deep Tendon Reflexes:     Reflex Scores:      Patellar reflexes are 2+ on the right side and 2+ on the left side. Psychiatric:        Mood and Affect: Mood normal.            Assessment & Plan:

## 2020-02-22 NOTE — Assessment & Plan Note (Signed)
Resolved. Recent mammogram and ultrasounds negative.

## 2020-02-22 NOTE — Assessment & Plan Note (Signed)
Denies concerns for GERD. Continue to monitor.  

## 2020-02-22 NOTE — Assessment & Plan Note (Signed)
Tetanus due, she declines today as she is scheduled to receive her second Covid-19 vaccine soon. She will set up a nurse visit for tetanus.  Pap smear UTD.  Commended her on lifestyle changes, encouraged to continue.  Exam today unremarkable. Labs reviewed.

## 2020-02-22 NOTE — Assessment & Plan Note (Signed)
A1C in late October 2021 of 6.4.  Since then she has changed her diet and is exercising regularly. Commended her on this.  Repeat A1C in January 2022.

## 2020-02-22 NOTE — Patient Instructions (Signed)
Continue exercising. You should be getting 150 minutes of moderate intensity exercise weekly.  Continue to work on a healthy diet. Ensure you are consuming 64 ounces of water daily.  Schedule a lab appointment and nurse visit (tetanus) for on or after April 16, 2020.  It was a pleasure to see you today!   Preventive Care 23-35 Years Old, Female Preventive care refers to visits with your health care provider and lifestyle choices that can promote health and wellness. This includes:  A yearly physical exam. This may also be called an annual well check.  Regular dental visits and eye exams.  Immunizations.  Screening for certain conditions.  Healthy lifestyle choices, such as eating a healthy diet, getting regular exercise, not using drugs or products that contain nicotine and tobacco, and limiting alcohol use. What can I expect for my preventive care visit? Physical exam Your health care provider will check your:  Height and weight. This may be used to calculate body mass index (BMI), which tells if you are at a healthy weight.  Heart rate and blood pressure.  Skin for abnormal spots. Counseling Your health care provider may ask you questions about your:  Alcohol, tobacco, and drug use.  Emotional well-being.  Home and relationship well-being.  Sexual activity.  Eating habits.  Work and work Statistician.  Method of birth control.  Menstrual cycle.  Pregnancy history. What immunizations do I need?  Influenza (flu) vaccine  This is recommended every year. Tetanus, diphtheria, and pertussis (Tdap) vaccine  You may need a Td booster every 10 years. Varicella (chickenpox) vaccine  You may need this if you have not been vaccinated. Human papillomavirus (HPV) vaccine  If recommended by your health care provider, you may need three doses over 6 months. Measles, mumps, and rubella (MMR) vaccine  You may need at least one dose of MMR. You may also need a second  dose. Meningococcal conjugate (MenACWY) vaccine  One dose is recommended if you are age 81-21 years and a first-year college student living in a residence hall, or if you have one of several medical conditions. You may also need additional booster doses. Pneumococcal conjugate (PCV13) vaccine  You may need this if you have certain conditions and were not previously vaccinated. Pneumococcal polysaccharide (PPSV23) vaccine  You may need one or two doses if you smoke cigarettes or if you have certain conditions. Hepatitis A vaccine  You may need this if you have certain conditions or if you travel or work in places where you may be exposed to hepatitis A. Hepatitis B vaccine  You may need this if you have certain conditions or if you travel or work in places where you may be exposed to hepatitis B. Haemophilus influenzae type b (Hib) vaccine  You may need this if you have certain conditions. You may receive vaccines as individual doses or as more than one vaccine together in one shot (combination vaccines). Talk with your health care provider about the risks and benefits of combination vaccines. What tests do I need?  Blood tests  Lipid and cholesterol levels. These may be checked every 5 years starting at age 80.  Hepatitis C test.  Hepatitis B test. Screening  Diabetes screening. This is done by checking your blood sugar (glucose) after you have not eaten for a while (fasting).  Sexually transmitted disease (STD) testing.  BRCA-related cancer screening. This may be done if you have a family history of breast, ovarian, tubal, or peritoneal cancers.  Pelvic exam  and Pap test. This may be done every 3 years starting at age 35. Starting at age 15, this may be done every 5 years if you have a Pap test in combination with an HPV test. Talk with your health care provider about your test results, treatment options, and if necessary, the need for more tests. Follow these instructions at  home: Eating and drinking   Eat a diet that includes fresh fruits and vegetables, whole grains, lean protein, and low-fat dairy.  Take vitamin and mineral supplements as recommended by your health care provider.  Do not drink alcohol if: ? Your health care provider tells you not to drink. ? You are pregnant, may be pregnant, or are planning to become pregnant.  If you drink alcohol: ? Limit how much you have to 0-1 drink a day. ? Be aware of how much alcohol is in your drink. In the [place] one drink equals one 12 oz bottle of beer (355 mL), one 5 oz glass of wine (148 mL), or one 1 oz glass of hard liquor (44 mL). Lifestyle  Take daily care of your teeth and gums.  Stay active. Exercise for at least 30 minutes on 5 or more days each week.  Do not use any products that contain nicotine or tobacco, such as cigarettes, e-cigarettes, and chewing tobacco. If you need help quitting, ask your health care provider.  If you are sexually active, practice safe sex. Use a condom or other form of birth control (contraception) in order to prevent pregnancy and STIs (sexually transmitted infections). If you plan to become pregnant, see your health care provider for a preconception visit. What's next?  Visit your health care provider once a year for a well check visit.  Ask your health care provider how often you should have your eyes and teeth checked.  Stay up to date on all vaccines. This information is not intended to replace advice given to you by your health care provider. Make sure you discuss any questions you have with your health care provider. Document Revised: 11/18/2017 Document Reviewed: 11/18/2017 Elsevier Patient Education  2020 [employer].

## 2020-02-22 NOTE — Assessment & Plan Note (Signed)
Improved. Recent CBC and iron studies unremarkable.

## 2020-04-24 ENCOUNTER — Other Ambulatory Visit: Payer: Self-pay | Admitting: Primary Care

## 2020-04-24 ENCOUNTER — Other Ambulatory Visit: Payer: BC Managed Care – PPO

## 2020-04-24 ENCOUNTER — Other Ambulatory Visit: Payer: Self-pay

## 2020-04-24 ENCOUNTER — Ambulatory Visit (INDEPENDENT_AMBULATORY_CARE_PROVIDER_SITE_OTHER): Payer: BC Managed Care – PPO

## 2020-04-24 DIAGNOSIS — R7303 Prediabetes: Secondary | ICD-10-CM

## 2020-04-24 DIAGNOSIS — Z23 Encounter for immunization: Secondary | ICD-10-CM | POA: Diagnosis not present

## 2020-04-24 LAB — POCT GLYCOSYLATED HEMOGLOBIN (HGB A1C): Hemoglobin A1C: 6 % — AB (ref 4.0–5.6)

## 2020-04-24 NOTE — Progress Notes (Signed)
Per orders of Mayra Reel, NP, injection of Tdap, given by Selina Cooley, RN. Patient tolerated injection well in R Deltoid.

## 2020-08-05 DIAGNOSIS — Z20822 Contact with and (suspected) exposure to covid-19: Secondary | ICD-10-CM | POA: Diagnosis not present

## 2020-08-13 ENCOUNTER — Ambulatory Visit: Payer: BC Managed Care – PPO | Admitting: Family Medicine

## 2020-08-13 ENCOUNTER — Other Ambulatory Visit: Payer: Self-pay

## 2020-08-13 ENCOUNTER — Encounter: Payer: Self-pay | Admitting: Family Medicine

## 2020-08-13 DIAGNOSIS — B370 Candidal stomatitis: Secondary | ICD-10-CM

## 2020-08-13 MED ORDER — NYSTATIN 100000 UNIT/ML MT SUSP: 5.0000 mL | Freq: Four times a day (QID) | OROMUCOSAL | 0 refills | Status: DC

## 2020-08-13 NOTE — Progress Notes (Signed)
This visit occurred during the SARS-CoV-2 public health emergency.  Safety protocols were in place, including screening questions prior to the visit, additional usage of staff PPE, and extensive cleaning of exam room while observing appropriate contact time as indicated for disinfecting solutions.  Tongue sx started a few weeks ago.  Tried peroxide, mouthwash, it scrapes off.  Doesn't hurt but taste is affected.  No FCNAVD except as below.  No inhalers.    She had covid recently but sx above predate that.  She had fever, chills, HA, fatigue, sweats, cough with COVID.  Sx are better except for the fatigue.  Change in taste since covid.    Meds, vitals, and allergies reviewed.   ROS: Per HPI unless specifically indicated in ROS section   nad ncat TM wnl Nasal exam wnl Likely thrush on dorsum of tongue, OP wnl o/w.  No lesions on ventral side of tongue or on the gums or other mucous membranes in the mouth. rrr ctab Neck supple, no LA

## 2020-08-13 NOTE — Patient Instructions (Signed)
This looks like thrush on your tongue.   Use nystatin in the meantime.  Let me know if this isn't getting better.  Take care.  Glad to see you.

## 2020-08-14 DIAGNOSIS — B370 Candidal stomatitis: Secondary | ICD-10-CM | POA: Insufficient documentation

## 2020-08-14 HISTORY — DX: Candidal stomatitis: B37.0

## 2020-08-26 ENCOUNTER — Encounter: Payer: Self-pay | Admitting: Family Medicine

## 2020-08-28 ENCOUNTER — Other Ambulatory Visit: Payer: Self-pay | Admitting: Family Medicine

## 2020-08-28 MED ORDER — FLUCONAZOLE 100 MG PO TABS: ORAL_TABLET | ORAL | 0 refills | Status: DC

## 2020-09-17 ENCOUNTER — Encounter: Payer: Self-pay | Admitting: Family Medicine

## 2020-09-17 ENCOUNTER — Other Ambulatory Visit: Payer: Self-pay

## 2020-09-17 ENCOUNTER — Ambulatory Visit (INDEPENDENT_AMBULATORY_CARE_PROVIDER_SITE_OTHER): Payer: BC Managed Care – PPO | Admitting: Family Medicine

## 2020-09-17 VITALS — BP 110/80 | HR 67 | Temp 97.4°F | Ht 66.0 in | Wt 220.0 lb

## 2020-09-17 DIAGNOSIS — Q383 Other congenital malformations of tongue: Secondary | ICD-10-CM

## 2020-09-17 DIAGNOSIS — R739 Hyperglycemia, unspecified: Secondary | ICD-10-CM

## 2020-09-17 LAB — POCT GLYCOSYLATED HEMOGLOBIN (HGB A1C): Hemoglobin A1C: 6.2 % — AB (ref 4.0–5.6)

## 2020-09-17 MED ORDER — FAMOTIDINE 20 MG PO TABS: 20.0000 mg | ORAL_TABLET | Freq: Every day | ORAL | Status: DC

## 2020-09-17 NOTE — Progress Notes (Signed)
This visit occurred during the SARS-CoV-2 public health emergency.  Safety protocols were in place, including screening questions prior to the visit, additional usage of staff PPE, and extensive cleaning of exam room while observing appropriate contact time as indicated for disinfecting solutions.  White changes on the tongue.  S/p nystatin and then diflucan course.  Sx didn't resolve.  She was still having recurrent sx, esp noted in the AM.  Tongue was always coated and white.  She could scrape it off.  No FCNAVD.  She doesn't have dry mouth.  Last A1c 6.0.  no other skin changes.    She had foamy urine once today but it may have been from toilet cleaner that had been used at work.  She hadn't noted it o/w.  I asked her to let me know if this is a recurrent issue.  She can feel a spasm in the L side of the neck and it will make patient cough.  She can breathe during an episode but can't talk well due to the cough.  She doesn't have a normal voice at that point, but her voice isn't absent.  Can happen at night when laying flat.  Can happen in the day.  No heartburn noted by patient.  Meds, vitals, and allergies reviewed.   ROS: Per HPI unless specifically indicated in ROS section   Nad Ncat MMM with white dorsal tongue.  Remainder of mouth is normal without ulceration or other lesions. Neck supple.  No lymphadenopathy.  No stridor. Voice sounds normal Skin well perfused. No wheeze.

## 2020-09-17 NOTE — Patient Instructions (Signed)
Let us get the swab resulted and in the meantime take pepcid 20mg  at night.  Sleep with your head elevated.  Take care.  Glad to see you.

## 2020-09-18 DIAGNOSIS — Q383 Other congenital malformations of tongue: Secondary | ICD-10-CM

## 2020-09-18 HISTORY — DX: Other congenital malformations of tongue: Q38.3

## 2020-09-18 NOTE — Assessment & Plan Note (Signed)
Not diabetic.  We rechecked A1c to verify this.  Discussed with patient.  She did not respond to antifungals previously.  We got fungal culture collected today, pending.  I do not know if this is related to the symptoms she is having in the neck.  Is sounds like she is having dysphonia/cough/possible laryngospasm episodically.  I do know she is having silent GERD.  Discussed options.  Reasonable to try Pepcid and elevate the head of the bed, await the fungal swab, and go from there.  We may need to refer her to ENT if she is not improving.  She agrees to plan.

## 2020-09-25 ENCOUNTER — Other Ambulatory Visit: Payer: Self-pay | Admitting: Family Medicine

## 2020-09-25 DIAGNOSIS — Q383 Other congenital malformations of tongue: Secondary | ICD-10-CM

## 2020-09-26 LAB — CULTURE, YEAST WITH LIMITED SUSCEPTIBILITY
MICRO NUMBER: 12059521
SPECIMEN QUALITY: ADEQUATE

## 2021-01-01 ENCOUNTER — Ambulatory Visit: Payer: BC Managed Care – PPO | Admitting: Primary Care

## 2021-01-01 ENCOUNTER — Encounter: Payer: Self-pay | Admitting: Primary Care

## 2021-01-01 ENCOUNTER — Other Ambulatory Visit: Payer: Self-pay

## 2021-01-01 DIAGNOSIS — K219 Gastro-esophageal reflux disease without esophagitis: Secondary | ICD-10-CM | POA: Diagnosis not present

## 2021-01-01 DIAGNOSIS — R590 Localized enlarged lymph nodes: Secondary | ICD-10-CM | POA: Diagnosis not present

## 2021-01-01 NOTE — Patient Instructions (Signed)
Start taking famotidine 20 mg once or twice daily for symptoms of cough, throat irritation. You can get this over the counter.  Monitor the site of your neck. Update me as discussed.  It was a pleasure to see you today!  Food Choices for Gastroesophageal Reflux Disease, Adult When you have gastroesophageal reflux disease (GERD), the foods you eat and your eating habits are very important. Choosing the right foods can help ease your discomfort. Think about working with a food expert (dietitian) to help you make good choices. What are tips for following this plan? Reading food labels Look for foods that are low in saturated fat. Foods that may help with your symptoms include: Foods that have less than 5% of daily value (DV) of fat. Foods that have 0 grams of trans fat. Cooking Do not fry your food. Cook your food by baking, steaming, grilling, or broiling. These are all methods that do not need a lot of fat for cooking. To add flavor, try to use herbs that are low in spice and acidity. Meal planning  Choose healthy foods that are low in fat, such as: Fruits and vegetables. Whole grains. Low-fat dairy products. Lean meats, fish, and poultry. Eat small meals often instead of eating 3 large meals each day. Eat your meals slowly in a place where you are relaxed. Avoid bending over or lying down until 2-3 hours after eating. Limit high-fat foods such as fatty meats or fried foods. Limit your intake of fatty foods, such as oils, butter, and shortening. Avoid the following as told by your doctor: Foods that cause symptoms. These may be different for different people. Keep a food diary to keep track of foods that cause symptoms. Alcohol. Drinking a lot of liquid with meals. Eating meals during the 2-3 hours before bed. Lifestyle Stay at a healthy weight. Ask your doctor what weight is healthy for you. If you need to lose weight, work with your doctor to do so safely. Exercise for at least 30  minutes on 5 or more days each week, or as told by your doctor. Wear loose-fitting clothes. Do not smoke or use any products that contain nicotine or tobacco. If you need help quitting, ask your doctor. Sleep with the head of your bed higher than your feet. Use a wedge under the mattress or blocks under the bed frame to raise the head of the bed. Chew sugar-free gum after meals. What foods should eat? Eat a healthy, well-balanced diet of fruits, vegetables, whole grains, low-fat dairy products, lean meats, fish, and poultry. Each person is different. Foods that may cause symptoms in one person may not cause any symptoms in another person. Work with your doctor to find foods that are safe for you. The items listed above may not be a complete list of what you can eat and drink. Contact a food expert for more options. What foods should I avoid? Limiting some of these foods may help in managing the symptoms of GERD. Everyone is different. Talk with a food expert or your doctor to help you find the exact foods to avoid, if any. Fruits Any fruits prepared with added fat. Any fruits that cause symptoms. For some people, this may include citrus fruits, such as oranges, grapefruit, pineapple, and lemons. Vegetables Deep-fried vegetables. [place] fries. Any vegetables prepared with added fat. Any vegetables that cause symptoms. For some people, this may include tomatoes and tomato products, chili peppers, onions and garlic, and horseradish. Grains Pastries or quick breads  with added fat. Meats and other proteins High-fat meats, such as fatty beef or pork, hot dogs, ribs, ham, sausage, salami, and bacon. Fried meat or protein, including fried fish and fried chicken. Nuts and nut butters, in large amounts. Dairy Whole milk and chocolate milk. Sour cream. Cream. Ice cream. Cream cheese. Milkshakes. Fats and oils Butter. Margarine. Shortening. Ghee. Beverages Coffee and tea, with or without caffeine.  Carbonated beverages. Sodas. Energy drinks. Fruit juice made with acidic fruits, such as orange or grapefruit. Tomato juice. Alcoholic drinks. Sweets and desserts Chocolate and cocoa. Donuts. Seasonings and condiments Pepper. Peppermint and spearmint. Added salt. Any condiments, herbs, or seasonings that cause symptoms. For some people, this may include curry, hot sauce, or vinegar-based salad dressings. The items listed above may not be a complete list of what you should not eat and drink. Contact a food expert for more options. Questions to ask your doctor Diet and lifestyle changes are often the first steps that are taken to manage symptoms of GERD. If diet and lifestyle changes do not help, talk with your doctor about taking medicines. Where to find more information [employer] for Gastrointestinal [employer]: aboutgerd.org Summary When you have GERD, food and lifestyle choices are very important in easing your symptoms. Eat small meals often instead of 3 large meals a day. Eat your meals slowly and in a place where you are relaxed. Avoid bending over or lying down until 2-3 hours after eating. Limit high-fat foods such as fatty meats or fried foods. This information is not intended to replace advice given to you by your health care provider. Make sure you discuss any questions you have with your health care provider. Document Revised: 09/18/2019 Document Reviewed: 09/18/2019 Elsevier Patient Education  2022 [employer].

## 2021-01-01 NOTE — Assessment & Plan Note (Signed)
Noted to right lateral neck.  No suspicious symptoms. Exam without alarm signs.  Will have patient monitor.  Consider soft tissue ultrasound if no improvement.

## 2021-01-01 NOTE — Assessment & Plan Note (Signed)
Suspect cough and throat irritation to be secondary to GERD.  Will have her start famotidine 20 mg once to twice daily. Discussed triggers for GERD.   She will update.

## 2021-01-01 NOTE — Progress Notes (Signed)
Subjective:    Patient ID: Jillian Contreras, female    DOB: 03/19/1986, 35 y.o.   MRN: [id number]  HPI  Jillian Contreras is a very pleasant 35 y.o. female with a history of GERD, menorrhagia, iron deficiency anemia who presents today to discuss skin mass.   The mass is located to the right lateral neck for which she noticed about one week ago. Since then she's noticed an increase in size. She denies pain, erythema, other neck masses, post nasal drip, fevers, unexplained weight loss.   She's noticed "spasms" to the inside of her throat to the tonsil when talking. She does have a "decayed" tooth to the right upper molar. She does have a chronic cough that occurs with laughing, wakes her from sleep "gasping for air", odd taste in her mouth in the morning. She's not taken anything for cough or other symptoms.    Review of Systems  Constitutional:  Negative for chills and fever.  HENT:  Negative for congestion, postnasal drip and rhinorrhea.   Respiratory:  Positive for cough. Negative for shortness of breath.   Skin:  Negative for color change.  Hematological:  Positive for adenopathy.        Past Medical History:  Diagnosis Date  . Anxiety   . Chest pain   . Hypokalemia   . Palpitations   . Palpitations   . Panic attack   . Panic attacks 12/27/2014    Social History   Socioeconomic History  . Marital status: Single    Spouse name: Not on file  . Number of children: Not on file  . Years of education: Not on file  . Highest education level: Not on file  Occupational History  . Occupation: customer service    Comment: Administrator, [employer]  Tobacco Use  . Smoking status: Never  . Smokeless tobacco: Never  Substance and Sexual Activity  . Alcohol use: No    Alcohol/week: 0.0 standard drinks  . Drug use: No  . Sexual activity: Not on file  Other Topics Concern  . Not on file  Social History Narrative  . Not on file   Social Determinants of Health   Financial Resource  Strain: Not on file  Food Insecurity: Not on file  Transportation Needs: Not on file  Physical Activity: Not on file  Stress: Not on file  Social Connections: Not on file  Intimate Partner Violence: Not on file    Past Surgical History:  Procedure Laterality Date  . CESAREAN SECTION      Family History  Problem Relation Age of Onset  . Cancer Paternal Aunt 12       breast  . Breast cancer Paternal Aunt   . Breast cancer Paternal Grandmother   . Breast cancer Paternal Aunt   . Breast cancer Paternal Aunt     No Known Allergies  No current outpatient medications on file prior to visit.   No current facility-administered medications on file prior to visit.    BP 118/82   Pulse 81   Temp 97.9 F (36.6 C) (Temporal)   Ht 5\' 6"  (1.676 m)   Wt 217 lb (98.4 kg)   SpO2 98%   BMI 35.02 kg/m  Objective:   Physical Exam Neck:     Thyroid: No thyroid mass.      Comments: Soft, immobile, non tender lymph node to right lateral cervical chain at base of neck Pulmonary:     Effort: Pulmonary effort is normal.  Breath sounds: Normal breath sounds.  Musculoskeletal:     Cervical back: Normal range of motion.  Lymphadenopathy:     Cervical: Cervical adenopathy present.     Right cervical: Deep cervical adenopathy present. No superficial cervical adenopathy. Neurological:     Mental Status: She is alert.          Assessment & Plan:      This visit occurred during the SARS-CoV-2 public health emergency.  Safety protocols were in place, including screening questions prior to the visit, additional usage of staff PPE, and extensive cleaning of exam room while observing appropriate contact time as indicated for disinfecting solutions.

## 2021-01-29 ENCOUNTER — Ambulatory Visit: Payer: BC Managed Care – PPO | Admitting: Primary Care

## 2021-02-25 ENCOUNTER — Encounter: Payer: BC Managed Care – PPO | Admitting: Primary Care

## 2021-02-26 ENCOUNTER — Other Ambulatory Visit: Payer: Self-pay

## 2021-02-26 ENCOUNTER — Ambulatory Visit (INDEPENDENT_AMBULATORY_CARE_PROVIDER_SITE_OTHER): Payer: BC Managed Care – PPO | Admitting: Primary Care

## 2021-02-26 ENCOUNTER — Encounter: Payer: Self-pay | Admitting: Primary Care

## 2021-02-26 ENCOUNTER — Other Ambulatory Visit (HOSPITAL_COMMUNITY)
Admission: RE | Admit: 2021-02-26 | Discharge: 2021-02-26 | Disposition: A | Discharge status: Home / Self Care | Payer: BC Managed Care – PPO | Source: Ambulatory Visit | Attending: Primary Care | Admitting: Primary Care

## 2021-02-26 VITALS — BP 118/82 | HR 88 | Temp 97.6°F | Ht 66.0 in | Wt 220.0 lb

## 2021-02-26 DIAGNOSIS — Z0000 Encounter for general adult medical examination without abnormal findings: Secondary | ICD-10-CM | POA: Diagnosis not present

## 2021-02-26 DIAGNOSIS — K219 Gastro-esophageal reflux disease without esophagitis: Secondary | ICD-10-CM

## 2021-02-26 DIAGNOSIS — Z124 Encounter for screening for malignant neoplasm of cervix: Secondary | ICD-10-CM

## 2021-02-26 DIAGNOSIS — R7303 Prediabetes: Secondary | ICD-10-CM

## 2021-02-26 DIAGNOSIS — E785 Hyperlipidemia, unspecified: Secondary | ICD-10-CM

## 2021-02-26 DIAGNOSIS — R590 Localized enlarged lymph nodes: Secondary | ICD-10-CM

## 2021-02-26 DIAGNOSIS — Q383 Other congenital malformations of tongue: Secondary | ICD-10-CM

## 2021-02-26 LAB — CBC
HCT: 36.1 % (ref 36.0–46.0)
Hemoglobin: 11.8 g/dL — ABNORMAL LOW (ref 12.0–15.0)
MCHC: 32.7 g/dL (ref 30.0–36.0)
MCV: 82.8 fl (ref 78.0–100.0)
Platelets: 346 10*3/uL (ref 150.0–400.0)
RBC: 4.36 Mil/uL (ref 3.87–5.11)
RDW: 14.6 % (ref 11.5–15.5)
WBC: 6.2 10*3/uL (ref 4.0–10.5)

## 2021-02-26 LAB — COMPREHENSIVE METABOLIC PANEL
ALT: 20 U/L (ref 0–35)
AST: 15 U/L (ref 0–37)
Albumin: 4.1 g/dL (ref 3.5–5.2)
Alkaline Phosphatase: 65 U/L (ref 39–117)
BUN: 10 mg/dL (ref 6–23)
CO2: 27 mEq/L (ref 19–32)
Calcium: 9.5 mg/dL (ref 8.4–10.5)
Chloride: 105 mEq/L (ref 96–112)
Creatinine, Ser: 0.67 mg/dL (ref 0.40–1.20)
GFR: 112.11 mL/min (ref >60.00)
Glucose, Bld: 119 mg/dL — ABNORMAL HIGH (ref 70–99)
Potassium: 4 mEq/L (ref 3.5–5.1)
Sodium: 137 mEq/L (ref 135–145)
Total Bilirubin: 0.3 mg/dL (ref 0.2–1.2)
Total Protein: 7.3 g/dL (ref 6.0–8.3)

## 2021-02-26 LAB — LIPID PANEL
Cholesterol: 225 mg/dL — ABNORMAL HIGH (ref 0–200)
HDL: 45.2 mg/dL (ref >39.00)
LDL Cholesterol: 143 mg/dL — ABNORMAL HIGH (ref 0–99)
NonHDL: 179.42
Total CHOL/HDL Ratio: 5
Triglycerides: 182 mg/dL — ABNORMAL HIGH (ref 0.0–149.0)
VLDL: 36.4 mg/dL (ref 0.0–40.0)

## 2021-02-26 LAB — HEMOGLOBIN A1C: Hgb A1c MFr Bld: 6.4 % (ref 4.6–6.5)

## 2021-02-26 NOTE — Assessment & Plan Note (Signed)
Discussed the importance of a healthy diet and regular exercise in order for weight loss, and to reduce the risk of further co-morbidity.  

## 2021-02-26 NOTE — Addendum Note (Signed)
Addended by: Doreene Nest on: 02/26/2021 11:02 AM   Modules accepted: Orders

## 2021-02-26 NOTE — Progress Notes (Signed)
Subjective:    Patient ID: Jillian Contreras, female    DOB: [birth year], 35 y.o.   MRN: [id number]  HPI  Jillian Contreras is a very pleasant 35 y.o. female who presents today for complete physical and follow up of chronic conditions.  Immunizations: -Tetanus: 2022 -Influenza: Declines  -Covid-19: 2 vaccines   Diet: Poor diet. Exercise: No regular exercise.  Eye exam: Completes annually  Dental exam: Completed several years ago  Pap Smear: Completed in 2019  BP Readings from Last 3 Encounters:  02/26/21 118/82  01/01/21 118/82  09/17/20 110/80   Wt Readings from Last 3 Encounters:  02/26/21 220 lb (99.8 kg)  01/01/21 217 lb (98.4 kg)  09/17/20 220 lb (99.8 kg)       Review of Systems  Constitutional:  Negative for unexpected weight change.  HENT:  Negative for rhinorrhea.   Respiratory:  Negative for cough and shortness of breath.   Cardiovascular:  Negative for chest pain.  Gastrointestinal:  Negative for constipation and diarrhea.  Genitourinary:  Negative for difficulty urinating and menstrual problem.  Musculoskeletal:  Negative for arthralgias and myalgias.  Skin:  Negative for rash.  Allergic/Immunologic: Negative for environmental allergies.  Neurological:  Positive for headaches. Negative for dizziness and numbness.       Headaches with menses  Hematological:  Positive for adenopathy.  Psychiatric/Behavioral:  The patient is not nervous/anxious.         Past Medical History:  Diagnosis Date   Anxiety    Chest pain    Exposure to COVID-19 virus 03/03/2019   Hypokalemia    Left upper quadrant abdominal pain 06/22/2019   Palpitations    Palpitations    Panic attack    Panic attacks 12/27/2014   Thrush 08/14/2020    Social History   Socioeconomic History   Marital status: Single    Spouse name: Not on file   Number of children: Not on file   Years of education: Not on file   Highest education level: Not on file  Occupational History    Occupation: customer service    Comment: Administrator, [employer]  Tobacco Use   Smoking status: Never   Smokeless tobacco: Never  Substance and Sexual Activity   Alcohol use: No    Alcohol/week: 0.0 standard drinks   Drug use: No   Sexual activity: Not on file  Other Topics Concern   Not on file  Social History Narrative   Not on file   Social Determinants of Health   Financial Resource Strain: Not on file  Food Insecurity: Not on file  Transportation Needs: Not on file  Physical Activity: Not on file  Stress: Not on file  Social Connections: Not on file  Intimate Partner Violence: Not on file    Past Surgical History:  Procedure Laterality Date   CESAREAN SECTION      Family History  Problem Relation Age of Onset   Cancer Paternal Aunt 52       breast   Breast cancer Paternal Aunt    Breast cancer Paternal Grandmother    Breast cancer Paternal Aunt    Breast cancer Paternal Aunt     No Known Allergies  No current outpatient medications on file prior to visit.   No current facility-administered medications on file prior to visit.    BP 118/82   Pulse 88   Temp 97.6 F (36.4 C) (Temporal)   Ht 5\' 6"  (1.676 m)   Wt 220 lb (99.8  kg)   LMP 02/09/2021   SpO2 96%   BMI 35.51 kg/m  Objective:   Physical Exam HENT:     Right Ear: Tympanic membrane and ear canal normal.     Left Ear: Tympanic membrane and ear canal normal.     Nose: Nose normal.  Eyes:     Conjunctiva/sclera: Conjunctivae normal.     Pupils: Pupils are equal, round, and reactive to light.  Neck:     Thyroid: No thyromegaly.      Comments: Mild cervical adenopathy bilaterally at base of anterior neck. Cardiovascular:     Rate and Rhythm: Normal rate and regular rhythm.     Heart sounds: No murmur heard. Pulmonary:     Effort: Pulmonary effort is normal.     Breath sounds: Normal breath sounds. No rales.  Abdominal:     General: Bowel sounds are normal.     Palpations: Abdomen is soft.      Tenderness: There is no abdominal tenderness.  Genitourinary:    Labia:        Right: No tenderness or lesion.        Left: No tenderness or lesion.      Vagina: Normal.     Cervix: Normal.     Uterus: Normal.      Adnexa: Right adnexa normal and left adnexa normal.  Musculoskeletal:        General: Normal range of motion.     Cervical back: Neck supple.  Lymphadenopathy:     Cervical: Cervical adenopathy present.  Skin:    General: Skin is warm and dry.     Findings: No rash.  Neurological:     Mental Status: She is alert and oriented to person, place, and time.     Cranial Nerves: No cranial nerve deficit.     Deep Tendon Reflexes: Reflexes are normal and symmetric.  Psychiatric:        Mood and Affect: Mood normal.          Assessment & Plan:      This visit occurred during the SARS-CoV-2 public health emergency.  Safety protocols were in place, including screening questions prior to the visit, additional usage of staff PPE, and extensive cleaning of exam room while observing appropriate contact time as indicated for disinfecting solutions.

## 2021-02-26 NOTE — Assessment & Plan Note (Signed)
Improved. Never took famotidine 20 mg. Discussed PRN use and to avoid triggers.  Continue to monitor.

## 2021-02-26 NOTE — Patient Instructions (Signed)
Stop by the lab prior to leaving today. I will notify you of your results once received.   You will be contacted regarding your ultrasound.  Please let us know if you have not been contacted within two weeks.   It was a pleasure to see you today!  Preventive Care 34-36 Years Old, Female Preventive care refers to lifestyle choices and visits with your health care provider that can promote health and wellness. Preventive care visits are also called wellness exams. What can I expect for my preventive care visit? Counseling During your preventive care visit, your health care provider may ask about your: Medical history, including: Past medical problems. Family medical history. Pregnancy history. Current health, including: Menstrual cycle. Method of birth control. Emotional well-being. Home life and relationship well-being. Sexual activity and sexual health. Lifestyle, including: Alcohol, nicotine or tobacco, and drug use. Access to firearms. Diet, exercise, and sleep habits. Work and work Statistician. Sunscreen use. Safety issues such as seatbelt and bike helmet use. Physical exam Your health care provider may check your: Height and weight. These may be used to calculate your BMI (body mass index). BMI is a measurement that tells if you are at a healthy weight. Waist circumference. This measures the distance around your waistline. This measurement also tells if you are at a healthy weight and may help predict your risk of certain diseases, such as type 2 diabetes and high blood pressure. Heart rate and blood pressure. Body temperature. Skin for abnormal spots. What immunizations do I need? Vaccines are usually given at various ages, according to a schedule. Your health care provider will recommend vaccines for you based on your age, medical history, and lifestyle or other factors, such as travel or where you work. What tests do I need? Screening Your health care provider may  recommend screening tests for certain conditions. This may include: Pelvic exam and Pap test. Lipid and cholesterol levels. Diabetes screening. This is done by checking your blood sugar (glucose) after you have not eaten for a while (fasting). Hepatitis B test. Hepatitis C test. HIV (human immunodeficiency virus) test. STI (sexually transmitted infection) testing, if you are at risk. BRCA-related cancer screening. This may be done if you have a family history of breast, ovarian, tubal, or peritoneal cancers. Talk with your health care provider about your test results, treatment options, and if necessary, the need for more tests. Follow these instructions at home: Eating and drinking  Eat a healthy diet that includes fresh fruits and vegetables, whole grains, lean protein, and low-fat dairy products. Take vitamin and mineral supplements as recommended by your health care provider. Do not drink alcohol if: Your health care provider tells you not to drink. You are pregnant, may be pregnant, or are planning to become pregnant. If you drink alcohol: Limit how much you have to 0-1 drink a day. Know how much alcohol is in your drink. In the [place] one drink equals one 12 oz bottle of beer (355 mL), one 5 oz glass of wine (148 mL), or one 1 oz glass of hard liquor (44 mL). Lifestyle Brush your teeth every morning and night with fluoride toothpaste. Floss one time each day. Exercise for at least 30 minutes 5 or more days each week. Do not use any products that contain nicotine or tobacco. These products include cigarettes, chewing tobacco, and vaping devices, such as e-cigarettes. If you need help quitting, ask your health care provider. Do not use drugs. If you are sexually active, practice safe  sex. Use a condom or other form of protection to prevent STIs. If you do not wish to become pregnant, use a form of birth control. If you plan to become pregnant, see your health care provider for a  prepregnancy visit. Find healthy ways to manage stress, such as: Meditation, yoga, or listening to music. Journaling. Talking to a trusted person. Spending time with friends and family. Minimize exposure to UV radiation to reduce your risk of skin cancer. Safety Always wear your seat belt while driving or riding in a vehicle. Do not drive: If you have been drinking alcohol. Do not ride with someone who has been drinking. If you have been using any mind-altering substances or drugs. While texting. When you are tired or distracted. Wear a helmet and other protective equipment during sports activities. If you have firearms in your house, make sure you follow all gun safety procedures. Seek help if you have been physically or sexually abused. What's next? Go to your health care provider once a year for an annual wellness visit. Ask your health care provider how often you should have your eyes and teeth checked. Stay up to date on all vaccines. This information is not intended to replace advice given to you by your health care provider. Make sure you discuss any questions you have with your health care provider. Document Revised: 09/04/2020 Document Reviewed: 09/04/2020 Elsevier Patient Education  [place].

## 2021-02-26 NOTE — Assessment & Plan Note (Signed)
Chronic and continued.  Never went to ENT. She plans on calling.

## 2021-02-26 NOTE — Assessment & Plan Note (Signed)
Discussed the importance of a healthy diet and regular exercise in order for weight loss, and to reduce the risk of further co-morbidity. ? ?Repeat A1C pending. ?

## 2021-02-26 NOTE — Assessment & Plan Note (Signed)
Chronic and continued.   Suspect either lymph node or cyst.  Will obtain soft tissue ultrasound.  Orders placed today.

## 2021-02-26 NOTE — Assessment & Plan Note (Signed)
Declines influenza vaccine. Tetanus UTD.  Pap smear due, completed today.  Discussed the importance of a healthy diet and regular exercise in order for weight loss, and to reduce the risk of further co-morbidity.  Exam today stable. Labs pending.

## 2021-02-27 ENCOUNTER — Encounter: Payer: Self-pay | Admitting: *Deleted

## 2021-02-27 LAB — CYTOLOGY - PAP
Comment: NEGATIVE
Diagnosis: NEGATIVE
High risk HPV: NEGATIVE

## 2021-04-11 ENCOUNTER — Other Ambulatory Visit: Payer: Self-pay

## 2021-04-11 ENCOUNTER — Ambulatory Visit
Admission: RE | Admit: 2021-04-11 | Discharge: 2021-04-11 | Disposition: A | Discharge status: Home / Self Care | Payer: BC Managed Care – PPO | Source: Ambulatory Visit | Attending: Primary Care | Admitting: Primary Care

## 2021-04-11 DIAGNOSIS — R590 Localized enlarged lymph nodes: Secondary | ICD-10-CM | POA: Diagnosis not present

## 2022-02-26 DIAGNOSIS — Z8719 Personal history of other diseases of the digestive system: Secondary | ICD-10-CM | POA: Diagnosis not present

## 2022-02-26 DIAGNOSIS — R059 Cough, unspecified: Secondary | ICD-10-CM | POA: Diagnosis not present

## 2022-02-26 DIAGNOSIS — J209 Acute bronchitis, unspecified: Secondary | ICD-10-CM | POA: Diagnosis not present

## 2022-02-26 DIAGNOSIS — Z03818 Encounter for observation for suspected exposure to other biological agents ruled out: Secondary | ICD-10-CM | POA: Diagnosis not present

## 2022-02-26 DIAGNOSIS — R509 Fever, unspecified: Secondary | ICD-10-CM | POA: Diagnosis not present

## 2022-02-26 DIAGNOSIS — J019 Acute sinusitis, unspecified: Secondary | ICD-10-CM | POA: Diagnosis not present

## 2022-02-26 DIAGNOSIS — R051 Acute cough: Secondary | ICD-10-CM | POA: Diagnosis not present

## 2022-05-04 ENCOUNTER — Encounter: Payer: Self-pay | Admitting: Internal Medicine

## 2022-05-04 ENCOUNTER — Ambulatory Visit: Payer: BC Managed Care – PPO | Admitting: Internal Medicine

## 2022-05-04 VITALS — BP 100/60 | HR 68 | Temp 97.9°F | Ht 65.0 in | Wt 229.0 lb

## 2022-05-04 DIAGNOSIS — R1032 Left lower quadrant pain: Secondary | ICD-10-CM

## 2022-05-04 DIAGNOSIS — B349 Viral infection, unspecified: Secondary | ICD-10-CM | POA: Diagnosis not present

## 2022-05-04 LAB — POC URINALSYSI DIPSTICK (AUTOMATED)
Bilirubin, UA: NEGATIVE
Blood, UA: NEGATIVE
Glucose, UA: NEGATIVE
Ketones, UA: NEGATIVE
Leukocytes, UA: NEGATIVE
Nitrite, UA: NEGATIVE
Protein, UA: POSITIVE — AB
Spec Grav, UA: 1.025 (ref 1.010–1.025)
Urobilinogen, UA: 0.2 E.U./dL
pH, UA: 5.5 (ref 5.0–8.0)

## 2022-05-04 NOTE — Assessment & Plan Note (Addendum)
Started with systemic symptoms but no sig respiratory ones Then abdominal pain and 2 loose stools No sig pain  No sig urinary symptoms---but will check to exclude UTI as cause Otherwise--just supportive care as no worrisome features on exam (specifically nothing to suggest appendicitis, cholecystitis or diverticulitis)  Urine negative--will just observe

## 2022-05-04 NOTE — Progress Notes (Signed)
Subjective:    Patient ID: Archie Balboa, female    DOB: 1985/11/08, 37 y.o.   MRN: [id number]  HPI Here due to an illness  7 days ago---woke and had dizziness and pain along right jaw Body aches and chills Better the next day but recurred the next day No fever No cough or sore throat Just slight nasal congestion  Then started with stomach pain 2 days ago 2 loose stools quickly Burning LLQ and suprapubic Some nausea yesterday and 2 days ago No vomiting  Gets some urinary pressure at urethra--not new No burning dysuria or urgency  No current outpatient medications on file prior to visit.   No current facility-administered medications on file prior to visit.    No Known Allergies  Past Medical History:  Diagnosis Date   Anxiety    Chest pain    Exposure to COVID-19 virus 03/03/2019   Hypokalemia    Left upper quadrant abdominal pain 06/22/2019   Palpitations    Palpitations    Panic attack    Panic attacks 12/27/2014   Thrush 08/14/2020    Past Surgical History:  Procedure Laterality Date   CESAREAN SECTION      Family History  Problem Relation Age of Onset   Cancer Paternal Aunt 59       breast   Breast cancer Paternal Aunt    Breast cancer Paternal Grandmother    Breast cancer Paternal Aunt    Breast cancer Paternal Aunt     Social History   Socioeconomic History   Marital status: Single    Spouse name: Not on file   Number of children: Not on file   Years of education: Not on file   Highest education level: Not on file  Occupational History   Occupation: customer service    Comment: Industrial/product designer  Tobacco Use   Smoking status: Never   Smokeless tobacco: Never  Substance and Sexual Activity   Alcohol use: No    Alcohol/week: 0.0 standard drinks of alcohol   Drug use: No   Sexual activity: Not on file  Other Topics Concern   Not on file  Social History Narrative   Not on file   Social Determinants of Health   Financial Resource  Strain: Not on file  Food Insecurity: Not on file  Transportation Needs: Not on file  Physical Activity: Not on file  Stress: Not on file  Social Connections: Not on file  Intimate Partner Violence: Not on file   Review of Systems Able to eat No rash Cousin with the flu---exposed several days before the illness Able to work from home---just left work early the first day    Objective:   Physical Exam Constitutional:      Appearance: Normal appearance.  Cardiovascular:     Rate and Rhythm: Normal rate and regular rhythm.     Heart sounds: No murmur heard.    No gallop.  Pulmonary:     Breath sounds: Normal breath sounds. No wheezing or rales.  Abdominal:     General: Bowel sounds are normal.     Palpations: Abdomen is soft. There is no mass.     Tenderness: There is no abdominal tenderness. There is no guarding or rebound.  Musculoskeletal:     Cervical back: Neck supple.     Right lower leg: No edema.     Left lower leg: No edema.  Lymphadenopathy:     Cervical: No cervical adenopathy.  Neurological:  Mental Status: She is alert.            Assessment & Plan:

## 2022-05-04 NOTE — Addendum Note (Signed)
Addended by: Pilar Grammes on: 05/04/2022 12:21 PM   Modules accepted: Orders

## 2022-05-13 ENCOUNTER — Ambulatory Visit (INDEPENDENT_AMBULATORY_CARE_PROVIDER_SITE_OTHER): Payer: BC Managed Care – PPO | Admitting: Primary Care

## 2022-05-13 ENCOUNTER — Encounter: Payer: Self-pay | Admitting: Primary Care

## 2022-05-13 VITALS — BP 122/76 | HR 66 | Temp 97.3°F | Ht 65.0 in | Wt 227.0 lb

## 2022-05-13 DIAGNOSIS — D509 Iron deficiency anemia, unspecified: Secondary | ICD-10-CM | POA: Diagnosis not present

## 2022-05-13 DIAGNOSIS — R7303 Prediabetes: Secondary | ICD-10-CM

## 2022-05-13 DIAGNOSIS — K219 Gastro-esophageal reflux disease without esophagitis: Secondary | ICD-10-CM

## 2022-05-13 DIAGNOSIS — E785 Hyperlipidemia, unspecified: Secondary | ICD-10-CM | POA: Diagnosis not present

## 2022-05-13 DIAGNOSIS — Z0000 Encounter for general adult medical examination without abnormal findings: Secondary | ICD-10-CM | POA: Diagnosis not present

## 2022-05-13 DIAGNOSIS — N922 Excessive menstruation at puberty: Secondary | ICD-10-CM

## 2022-05-13 LAB — COMPREHENSIVE METABOLIC PANEL
ALT: 21 U/L (ref 0–35)
AST: 18 U/L (ref 0–37)
Albumin: 4.4 g/dL (ref 3.5–5.2)
Alkaline Phosphatase: 73 U/L (ref 39–117)
BUN: 9 mg/dL (ref 6–23)
CO2: 24 mEq/L (ref 19–32)
Calcium: 9.6 mg/dL (ref 8.4–10.5)
Chloride: 105 mEq/L (ref 96–112)
Creatinine, Ser: 0.78 mg/dL (ref 0.40–1.20)
GFR: 96.6 mL/min (ref >60.00)
Glucose, Bld: 121 mg/dL — ABNORMAL HIGH (ref 70–99)
Potassium: 4.1 mEq/L (ref 3.5–5.1)
Sodium: 139 mEq/L (ref 135–145)
Total Bilirubin: 0.4 mg/dL (ref 0.2–1.2)
Total Protein: 7.6 g/dL (ref 6.0–8.3)

## 2022-05-13 LAB — IBC + FERRITIN
Ferritin: 14.2 ng/mL (ref 10.0–291.0)
Iron: 88 ug/dL (ref 42–145)
Saturation Ratios: 19.5 % — ABNORMAL LOW (ref 20.0–50.0)
TIBC: 450.8 ug/dL — ABNORMAL HIGH (ref 250.0–450.0)
Transferrin: 322 mg/dL (ref 212.0–360.0)

## 2022-05-13 LAB — CBC
HCT: 36 % (ref 36.0–46.0)
Hemoglobin: 11.9 g/dL — ABNORMAL LOW (ref 12.0–15.0)
MCHC: 33.2 g/dL (ref 30.0–36.0)
MCV: 81.3 fl (ref 78.0–100.0)
Platelets: 344 10*3/uL (ref 150.0–400.0)
RBC: 4.42 Mil/uL (ref 3.87–5.11)
RDW: 14.7 % (ref 11.5–15.5)
WBC: 6 10*3/uL (ref 4.0–10.5)

## 2022-05-13 LAB — LIPID PANEL
Cholesterol: 230 mg/dL — ABNORMAL HIGH (ref 0–200)
HDL: 42.1 mg/dL (ref >39.00)
LDL Cholesterol: 159 mg/dL — ABNORMAL HIGH (ref 0–99)
NonHDL: 187.84
Total CHOL/HDL Ratio: 5
Triglycerides: 143 mg/dL (ref 0.0–149.0)
VLDL: 28.6 mg/dL (ref 0.0–40.0)

## 2022-05-13 LAB — HEMOGLOBIN A1C: Hgb A1c MFr Bld: 7 % — ABNORMAL HIGH (ref 4.6–6.5)

## 2022-05-13 NOTE — Assessment & Plan Note (Signed)
Improved. Continue to monitor. 

## 2022-05-13 NOTE — Assessment & Plan Note (Signed)
Repeat A1C pending.  Discussed the importance of a healthy diet and regular exercise in order for weight loss, and to reduce the risk of further co-morbidity.

## 2022-05-13 NOTE — Patient Instructions (Signed)
Stop by the lab prior to leaving today. I will notify you of your results once received.   It was a pleasure to see you today!  

## 2022-05-13 NOTE — Progress Notes (Signed)
Subjective:    Patient ID: Jillian Contreras, female    DOB: 11-26-1985, 37 y.o.   MRN: [id number]  HPI  Jillian Contreras is a very pleasant 37 y.o. female who presents today for complete physical and follow up of chronic conditions.  Immunizations: -Tetanus: Completed in 2022 -Influenza: Completed this season  Diet: [place].  Exercise: No regular exercise.  Eye exam: Completes every other year Dental exam: Completes semi-annually   Pap Smear: Completed in 2022  BP Readings from Last 3 Encounters:  05/13/22 122/76  05/04/22 100/60  02/26/21 118/82     Review of Systems  Constitutional:  Negative for unexpected weight change.  HENT:  Negative for rhinorrhea.   Respiratory:  Negative for cough and shortness of breath.   Cardiovascular:  Negative for chest pain.  Gastrointestinal:  Negative for constipation and diarrhea.  Genitourinary:  Negative for difficulty urinating and menstrual problem.  Musculoskeletal:  Negative for arthralgias and myalgias.  Skin:  Negative for rash.  Allergic/Immunologic: Negative for environmental allergies.  Neurological:  Negative for dizziness and headaches.  Psychiatric/Behavioral:  The patient is not nervous/anxious.          Past Medical History:  Diagnosis Date   Anxiety    Chest pain    Exposure to COVID-19 virus 03/03/2019   Hypokalemia    Left upper quadrant abdominal pain 06/22/2019   Palpitations    Palpitations    Panic attack    Panic attacks 12/27/2014   Thrush 08/14/2020    Social History   Socioeconomic History   Marital status: Single    Spouse name: Not on file   Number of children: Not on file   Years of education: Not on file   Highest education level: Not on file  Occupational History   Occupation: customer service    Comment: Industrial/product designer  Tobacco Use   Smoking status: Never   Smokeless tobacco: Never  Substance and Sexual Activity   Alcohol use: No    Alcohol/week: 0.0 standard drinks of  alcohol   Drug use: No   Sexual activity: Not on file  Other Topics Concern   Not on file  Social History Narrative   Not on file   Social Determinants of Health   Financial Resource Strain: Not on file  Food Insecurity: Not on file  Transportation Needs: Not on file  Physical Activity: Not on file  Stress: Not on file  Social Connections: Not on file  Intimate Partner Violence: Not on file    Past Surgical History:  Procedure Laterality Date   CESAREAN SECTION      Family History  Problem Relation Age of Onset   Cancer Paternal Aunt 44       breast   Breast cancer Paternal Aunt    Breast cancer Paternal Grandmother    Breast cancer Paternal Aunt    Breast cancer Paternal Aunt     No Known Allergies  Current Outpatient Medications on File Prior to Visit  Medication Sig Dispense Refill   omeprazole (PRILOSEC) 20 MG capsule Take 1 capsule (20 mg total) by mouth once daily (Patient not taking: Reported on 05/13/2022)     No current facility-administered medications on file prior to visit.    BP 122/76   Pulse 66   Temp (!) 97.3 F (36.3 C) (Temporal)   Ht 5' 5"$  (1.651 m)   Wt 227 lb (103 kg)   LMP 05/13/2022 (Exact Date)   SpO2 98%  BMI 37.77 kg/m  Objective:   Physical Exam HENT:     Right Ear: Tympanic membrane and ear canal normal.     Left Ear: Tympanic membrane and ear canal normal.     Nose: Nose normal.  Eyes:     Conjunctiva/sclera: Conjunctivae normal.     Pupils: Pupils are equal, round, and reactive to light.  Neck:     Thyroid: No thyromegaly.  Cardiovascular:     Rate and Rhythm: Normal rate and regular rhythm.     Heart sounds: No murmur heard. Pulmonary:     Effort: Pulmonary effort is normal.     Breath sounds: Normal breath sounds. No rales.  Abdominal:     General: Bowel sounds are normal.     Palpations: Abdomen is soft.     Tenderness: There is no abdominal tenderness.  Musculoskeletal:        General: Normal range of motion.      Cervical back: Neck supple.  Lymphadenopathy:     Cervical: No cervical adenopathy.  Skin:    General: Skin is warm and dry.     Findings: No rash.  Neurological:     Mental Status: She is alert and oriented to person, place, and time.     Cranial Nerves: No cranial nerve deficit.     Deep Tendon Reflexes: Reflexes are normal and symmetric.  Psychiatric:        Mood and Affect: Mood normal.           Assessment & Plan:  Gastroesophageal reflux disease without esophagitis Assessment & Plan: Controlled.  Continue omeprazole 20 mg PRN.  Discussed triggers for GERD today.   Prediabetes Assessment & Plan: Repeat A1C pending.  Discussed the importance of a healthy diet and regular exercise in order for weight loss, and to reduce the risk of further co-morbidity.   Orders: -     Hemoglobin A1c -     Comprehensive metabolic panel  Excessive menstruation at puberty Assessment & Plan: Improved.  Continue to monitor.   Orders: -     Comprehensive metabolic panel  Hyperlipidemia, unspecified hyperlipidemia type Assessment & Plan: Repeat lipid panel pending.  Discussed the importance of a healthy diet and regular exercise in order for weight loss, and to reduce the risk of further co-morbidity.   Orders: -     Lipid panel  Iron deficiency anemia, unspecified iron deficiency anemia type Assessment & Plan: Prior history of menorrhagia which has improved.  Repeat CBC and iron studies pending.   Orders: -     CBC -     IBC + Ferritin  Preventative health care Assessment & Plan: Immunizations UTD. Pap smear UTD.  Discussed the importance of a healthy diet and regular exercise in order for weight loss, and to reduce the risk of further co-morbidity.  Exam stable. Labs pending.  Follow up in 1 year for repeat physical.          Pleas Koch, NP

## 2022-05-13 NOTE — Assessment & Plan Note (Signed)
Immunizations UTD. Pap smear UTD.  Discussed the importance of a healthy diet and regular exercise in order for weight loss, and to reduce the risk of further co-morbidity.  Exam stable. Labs pending.  Follow up in 1 year for repeat physical.

## 2022-05-13 NOTE — Assessment & Plan Note (Signed)
Prior history of menorrhagia which has improved.  Repeat CBC and iron studies pending.

## 2022-05-13 NOTE — Assessment & Plan Note (Signed)
Controlled.  Continue omeprazole 20 mg PRN.  Discussed triggers for GERD today.

## 2022-05-13 NOTE — Assessment & Plan Note (Signed)
Repeat lipid panel pending.  Discussed the importance of a healthy diet and regular exercise in order for weight loss, and to reduce the risk of further co-morbidity.

## 2022-05-15 ENCOUNTER — Encounter: Payer: BC Managed Care – PPO | Admitting: Primary Care

## 2022-05-18 ENCOUNTER — Other Ambulatory Visit: Payer: BC Managed Care – PPO

## 2022-05-18 DIAGNOSIS — E785 Hyperlipidemia, unspecified: Secondary | ICD-10-CM | POA: Diagnosis not present

## 2022-05-18 NOTE — Addendum Note (Signed)
Addended by: Ellamae Sia on: 05/18/2022 09:33 AM   Modules accepted: Orders

## 2022-05-19 ENCOUNTER — Other Ambulatory Visit: Payer: BC Managed Care – PPO

## 2022-05-19 DIAGNOSIS — E785 Hyperlipidemia, unspecified: Secondary | ICD-10-CM | POA: Diagnosis not present

## 2022-05-19 NOTE — Addendum Note (Signed)
Addended by: Ellamae Sia on: 05/19/2022 02:32 PM   Modules accepted: Orders

## 2022-05-22 ENCOUNTER — Encounter: Payer: Self-pay | Admitting: Nurse Practitioner

## 2022-05-22 ENCOUNTER — Ambulatory Visit: Payer: BC Managed Care – PPO | Admitting: Nurse Practitioner

## 2022-05-22 VITALS — BP 128/76 | HR 64 | Temp 97.6°F | Ht 65.0 in | Wt 220.6 lb

## 2022-05-22 DIAGNOSIS — R350 Frequency of micturition: Secondary | ICD-10-CM | POA: Diagnosis not present

## 2022-05-22 DIAGNOSIS — R300 Dysuria: Secondary | ICD-10-CM | POA: Diagnosis not present

## 2022-05-22 LAB — POC URINALSYSI DIPSTICK (AUTOMATED)
Bilirubin, UA: NEGATIVE
Blood, UA: NEGATIVE
Glucose, UA: NEGATIVE
Ketones, UA: NEGATIVE
Leukocytes, UA: NEGATIVE
Nitrite, UA: NEGATIVE
Protein, UA: NEGATIVE
Spec Grav, UA: >=1.03 — AB (ref 1.010–1.025)
Urobilinogen, UA: 0.2 E.U./dL
pH, UA: 5.5 (ref 5.0–8.0)

## 2022-05-22 NOTE — Assessment & Plan Note (Signed)
UA in office.  Plenty of fluids avoid bladder irritants pending urine culture ambulatory referral to urology to rule out interstitial cystitis if symptoms do not abate

## 2022-05-22 NOTE — Patient Instructions (Signed)
Nice to see you today I would work on increasing your fluid intake (water) Avoid bladder irritants like caffeine and artifical sugars

## 2022-05-22 NOTE — Assessment & Plan Note (Signed)
UA negative in office.  Did discuss avoiding bladder irritants and stay well-hydrated.  Will send culture since patient is having symptoms.  Ambulatory referral to urology if symptoms do not resolve to rule out interstitial cystitis

## 2022-05-22 NOTE — Telephone Encounter (Signed)
Per [employer] approval, added patient on for 2:20pm today.

## 2022-05-22 NOTE — Progress Notes (Signed)
Acute Office Visit  Subjective:     Patient ID: Jillian Contreras, female    DOB: Dec 31, 1985, 37 y.o.   MRN: [id number]  Chief Complaint  Patient presents with   Dysuria     Patient is in today for urinary complaints with history of prediabetes, menorrhagia, obesity.  Symptoms started several weeks ago. States that she was told that she has protein in her urine. States that she was told it was because of a virus. States that her lymphnodes swole. States that she has cut caffiene, sugary drinks. States that she did chickory with light brown sugar.  Patient states no recent sexual intercourse or bubble baths  Review of Systems  Constitutional:  Negative for chills and fever.  Gastrointestinal:  Positive for diarrhea. Negative for abdominal pain, nausea and vomiting.  Genitourinary:  Positive for dysuria and frequency. Negative for hematuria.       "+" incomplete emptying         Objective:    BP 128/76   Pulse 64   Temp 97.6 F (36.4 C) (Temporal)   Ht '5\' 5"'$  (1.651 m)   Wt 220 lb 9.6 oz (100.1 kg)   LMP 05/13/2022 (Exact Date)   SpO2 98%   BMI 36.71 kg/m  BP Readings from Last 3 Encounters:  05/22/22 128/76  05/13/22 122/76  05/04/22 100/60   Wt Readings from Last 3 Encounters:  05/22/22 220 lb 9.6 oz (100.1 kg)  05/13/22 227 lb (103 kg)  05/04/22 229 lb (103.9 kg)      Physical Exam Vitals and nursing note reviewed.  Constitutional:      Appearance: Normal appearance.  Cardiovascular:     Rate and Rhythm: Normal rate and regular rhythm.     Heart sounds: Normal heart sounds.  Pulmonary:     Effort: Pulmonary effort is normal.     Breath sounds: Normal breath sounds.  Abdominal:     General: Bowel sounds are normal. There is no distension.     Palpations: There is no mass.     Tenderness: There is no abdominal tenderness. There is no right CVA tenderness or left CVA tenderness.     Hernia: No hernia is present.  Neurological:     Mental Status: She is  alert.     Results for orders placed or performed in visit on 05/22/22  POCT Urinalysis Dipstick (Automated)  Result Value Ref Range   Color, UA yellow    Clarity, UA clear    Glucose, UA Negative Negative   Bilirubin, UA Negative    Ketones, UA Negative    Spec Grav, UA >=1.030 (A) 1.010 - 1.025   Blood, UA Negative    pH, UA 5.5 5.0 - 8.0   Protein, UA Negative Negative   Urobilinogen, UA 0.2 0.2 or 1.0 E.U./dL   Nitrite, UA negative    Leukocytes, UA Negative Negative        Assessment & Plan:   Problem List Items Addressed This Visit       Other   Urinary frequency    UA negative in office.  Did discuss avoiding bladder irritants and stay well-hydrated.  Will send culture since patient is having symptoms.  Ambulatory referral to urology if symptoms do not resolve to rule out interstitial cystitis      Relevant Orders   Urine Culture   Ambulatory referral to Urology   Dysuria - Primary    UA in office.  Plenty of fluids avoid bladder irritants  pending urine culture ambulatory referral to urology to rule out interstitial cystitis if symptoms do not abate      Relevant Orders   POCT Urinalysis Dipstick (Automated) (Completed)   Urine Culture   Ambulatory referral to Urology    No orders of the defined types were placed in this encounter.   Return if symptoms worsen or fail to improve.  Romilda Garret, NP

## 2022-05-23 LAB — URINE CULTURE
MICRO NUMBER:: 14637925
SPECIMEN QUALITY:: ADEQUATE

## 2022-05-24 LAB — TIQ-NTM

## 2022-05-24 LAB — LIPOPROTEIN A (LPA): Lipoprotein (a): 182 nmol/L — ABNORMAL HIGH (ref <75)

## 2022-05-26 LAB — LIPOPROTEIN A (LPA): Lipoprotein (a): 194 nmol/L — ABNORMAL HIGH (ref <75)

## 2022-06-09 ENCOUNTER — Other Ambulatory Visit: Payer: Self-pay

## 2022-06-09 ENCOUNTER — Encounter: Payer: Self-pay | Admitting: Urology

## 2022-06-09 ENCOUNTER — Ambulatory Visit (INDEPENDENT_AMBULATORY_CARE_PROVIDER_SITE_OTHER): Payer: Self-pay | Admitting: Urology

## 2022-06-09 ENCOUNTER — Other Ambulatory Visit
Admission: RE | Admit: 2022-06-09 | Discharge: 2022-06-09 | Disposition: A | Discharge status: Home / Self Care | Payer: BC Managed Care – PPO | Attending: Urology | Admitting: Urology

## 2022-06-09 VITALS — BP 108/68 | HR 77 | Ht 65.0 in | Wt 222.0 lb

## 2022-06-09 DIAGNOSIS — M6289 Other specified disorders of muscle: Secondary | ICD-10-CM | POA: Diagnosis not present

## 2022-06-09 DIAGNOSIS — R300 Dysuria: Secondary | ICD-10-CM | POA: Diagnosis not present

## 2022-06-09 DIAGNOSIS — R109 Unspecified abdominal pain: Secondary | ICD-10-CM

## 2022-06-09 LAB — URINALYSIS, COMPLETE (UACMP) WITH MICROSCOPIC
Bilirubin Urine: NEGATIVE
Glucose, UA: NEGATIVE mg/dL
Ketones, ur: NEGATIVE mg/dL
Nitrite: NEGATIVE
Protein, ur: NEGATIVE mg/dL
Specific Gravity, Urine: 1.01 (ref 1.005–1.030)
pH: 6.5 (ref 5.0–8.0)

## 2022-06-09 MED ORDER — CELECOXIB 200 MG PO CAPS: 200.0000 mg | ORAL_CAPSULE | Freq: Two times a day (BID) | ORAL | 0 refills | Status: DC

## 2022-06-09 NOTE — Progress Notes (Signed)
   06/09/22 3:46 PM   Jillian Contreras [birth year] [id number]  CC: Dysuria, pelvic pressure  HPI: 37 year old female with history of anxiety and obesity with BMI of 37 who reports about a month of burning with urination and pelvic pressure.  She has cut out carbonated drinks, soda, and coffee.  She denies any flank pain.  No bowel problems or constipation.  She denies any change in her symptoms after voiding.  The dysuria comes and goes.  She has no problem with urination overnight.  Primary complaint is pelvic pressure and dysuria.  Urinalysis has been benign multiple times.  UA today pending.  No imaging to review.   PMH: Past Medical History:  Diagnosis Date   Anxiety    Chest pain    Exposure to COVID-19 virus 03/03/2019   Hypokalemia    Left upper quadrant abdominal pain 06/22/2019   Palpitations    Palpitations    Panic attack    Panic attacks 12/27/2014   Thrush 08/14/2020     Family History: Family History  Problem Relation Age of Onset   Cancer Paternal Aunt 69       breast   Breast cancer Paternal Aunt    Breast cancer Paternal Grandmother    Breast cancer Paternal Aunt    Breast cancer Paternal Aunt     Social History:  reports that she has never smoked. She has never been exposed to tobacco smoke. She has never used smokeless tobacco. She reports that she does not drink alcohol and does not use drugs.  Physical Exam: BP 108/68   Pulse 77   Ht 5\' 5"  (1.651 m)   Wt 222 lb (100.7 kg)   LMP 05/13/2022 (Exact Date)   BMI 36.94 kg/m    Constitutional:  Alert and oriented, No acute distress. Cardiovascular: No clubbing, cyanosis, or edema. Respiratory: Normal respiratory effort, no increased work of breathing. GI: Abdomen is soft, nontender, nondistended, no abdominal masses  Assessment & Plan:   37 year old female with dysuria and pelvic pressure of unclear etiology.  UA benign multiple times, UA today pending. We discussed the complexities of pelvic pain and  possible range of etiologies including pelvic floor dysfunction, chronic bladder pain syndrome, and interstitial cystitis.  We reviewed the AUA guidelines that recommend an algorithmic approach to treatment for these patients, and that a trial of different medications and strategies is sometimes needed to find the approach that works best for each patient's unique situation.  I reinforced the importance of stress management, relaxation, avoiding triggers, and pain management in the approach to pelvic pain.  Trial of Celebrex 200 mg twice daily x 10 days, referral placed to pelvic floor PT, RTC 6 weeks symptom check.  Could consider trial of amitriptyline and follow-up if no improvement in symptoms  Nickolas Madrid, MD 06/09/2022  [facility] [address], [place], [place] [address] [phone]

## 2022-06-09 NOTE — Addendum Note (Signed)
Addended by: Despina Hidden on: 06/09/2022 04:18 PM   Modules accepted: Orders

## 2022-06-09 NOTE — Patient Instructions (Signed)
Pelvic Floor Dysfunction, Female  Pelvic floor dysfunction (PFD) is a condition that results when the group of muscles and connective tissues that support the organs in the pelvis (pelvic floor muscles) do not work well. These muscles and their connections form a sling that supports the colon and bladder. In women, they also support the uterus. PFD causes pelvic floor muscles to be too weak, too tight, or both. In PFD, muscle movements are not coordinated. This may cause bowel or bladder problems. It may also cause pain. What are the causes? This condition may be caused by an injury to the pelvic area or by a weakening of pelvic muscles. This often results from pregnancy and childbirth or other types of strain. In many cases, the exact cause is not known. What increases the risk? The following factors may make you more likely to develop this condition: Having chronic bladder tissue inflammation (interstitial cystitis). Being an older person. Being overweight. History of radiation treatment for cancer in the pelvic region. Previous pelvic surgery, such as removal of the uterus (hysterectomy). What are the signs or symptoms? Symptoms of this condition vary and may include: Bladder symptoms, such as: Trouble starting urination and emptying the bladder. Frequent urinary tract infections. Leaking urine when coughing, laughing, or exercising (stress incontinence). Having to pass urine urgently or frequently. Pain when passing urine. Bowel symptoms, such as: Constipation. Urgent or frequent bowel movements. Incomplete bowel movements. Painful bowel movements. Leaking stool or gas. Unexplained genital or rectal pain. Genital or rectal muscle spasms. Low back pain. Other symptoms may include: A heavy, full, or aching feeling in the vagina. A bulge that protrudes into the vagina. Pain during or after sex. How is this diagnosed? This condition may be diagnosed based on: Your symptoms and  medical history. A physical exam. During the exam, your health care provider may check your pelvic muscles for tightness, spasm, pain, or weakness. This may include a rectal exam and a pelvic exam. In some cases, you may have diagnostic tests, such as: Electrical muscle function tests. Urine flow testing. X-ray tests of bowel function. Ultrasound of the pelvic organs. How is this treated? Treatment for this condition depends on the symptoms. Treatment options include: Physical therapy. This may include Kegel exercises to help relax or strengthen the pelvic floor muscles. Biofeedback. This type of therapy provides feedback on how tight your pelvic floor muscles are so that you can learn to control them. Internal or external massage therapy. A treatment that involves electrical stimulation of the pelvic floor muscles to help control pain (transcutaneous electrical nerve stimulation, or TENS). Sound wave therapy (ultrasound) to reduce muscle spasms. Medicines, such as: Muscle relaxants. Bladder control medicines. Surgery to reconstruct or support pelvic floor muscles may be an option if other treatments do not help. Follow these instructions at home: Activity Do your usual activities as told by your health care provider. Ask your health care provider if you should modify any activities. Do pelvic floor strengthening or relaxing exercises at home as told by your physical therapist. Lifestyle Maintain a healthy weight. Eat foods that are high in fiber, such as beans, whole grains, and fresh fruits and vegetables. Limit foods that are high in fat and processed sugars, such as fried or sweet foods. Manage stress with relaxation techniques such as yoga or meditation. General instructions If you have problems with leakage: Use absorbable pads or wear padded underwear. Wash frequently with mild soap. Keep your genital and anal area as clean and dry as  possible. Ask your health care provider if  you should try a barrier cream to prevent skin irritation. Take warm baths to relieve pelvic muscle tension or spasms. Take over-the-counter and prescription medicines only as told by your health care provider. Keep all follow-up visits. How is this prevented? The cause of PFD is not always known, but there are a few things you can do to reduce the risk of developing this condition, including: Staying at a healthy weight. Getting regular exercise. Managing stress. Contact a health care provider if: Your symptoms are not improving with home care. You have signs or symptoms of PFD that get worse at home. You develop new signs or symptoms. You have signs of a urinary tract infection, such as: Fever. Chills. Increased urinary frequency. A burning feeling when urinating. You have not had a bowel movement in 3 days (constipation). Summary Pelvic floor dysfunction results when the muscles and connective tissues in your pelvic floor do not work well. These muscles and their connections form a sling that supports your colon and bladder. In women, they also support the uterus. PFD may be caused by an injury to the pelvic area or by a weakening of pelvic muscles. PFD causes pelvic floor muscles to be too weak, too tight, or a combination of both. Symptoms may vary from person to person. In most cases, PFD can be treated with physical therapies and medicines. Surgery may be an option if other treatments do not help. This information is not intended to replace advice given to you by your health care provider. Make sure you discuss any questions you have with your health care provider. Document Revised: 07/17/2020 Document Reviewed: 07/17/2020 Elsevier Patient Education  [place] for Interstitial Cystitis Interstitial cystitis (IC) is a long-term (chronic) condition that causes pain and pressure in the bladder, the lower abdomen, and the pelvic area. Other symptoms of IC  include urinary urgency and frequency. Symptoms tend to come and go. Many people with IC find that certain foods trigger their symptoms. Different foods may be problematic for different people. Some foods are more likely to cause symptoms than others. Learning which foods bother you and which do not can help you come up with an eating plan to manage IC. What are tips for following this plan? You may find it helpful to work with a dietitian. A dietician can help you develop an eating plan by doing an elimination diet. This diet involves: Creating a list of foods that you think trigger your IC symptoms combined with the foods that most commonly trigger symptoms for many people with IC. It may take several months to find out which foods bother you. Eliminating those foods from your diet for about one month, then reintroducing the foods one at a time to see which ones trigger your symptoms. Reading food labels Once you know which foods trigger your IC symptoms, you can avoid them. However, it is also a good idea to read food labels because some foods that trigger your symptoms may be included as ingredients in other foods. These ingredients may include: Soy. Worcestershire sauce. Vinegar. Alcohol. Artificial sweeteners. Monosodium glutamate. Other potential triggers include: Chili peppers. Tomato products. Citrus fruits, flavors, or juices. Shopping Shopping can be a challenge if many foods trigger your IC. When you go grocery shopping, bring a list of the foods you cannot eat. You can get an app for your phone that lets you know which foods are the safest and which you  may want to avoid. You can find the app at the Interstitial Cystitis Network website: www.ic-network.com Meal planning Plan your meals according to the results of your elimination diet. If you have not done an elimination diet, plan meals according to IC food lists recommended by your health care provider or dietitian. These lists  tell you which foods are least and most likely to cause symptoms. Avoid certain types of food when you go out to eat, such as pizza and foods typically served at [place], [place], and [place]. These foods often contain ingredients that can aggravate IC. General information Here are some general guidelines for an IC eating plan: Do not eat large portions. Drink plenty of fluids with your meals. Do not eat foods that are high in sugar, salt, or saturated fat. Choose whole fruits instead of juice. Eat a colorful variety of vegetables. What foods should I eat? For people with IC, the best diet is a balanced one that includes things from all the food groups. Even if you have to avoid certain foods, there are still plenty of healthy choices in each group. The following are some foods that are least bothersome and may be safest to eat: Fruits Bananas. Blueberries and blueberry juice. Melons. Pears. Apples. Dates. Prunes. Raisins. Apricots. Vegetables Asparagus. Avocado. Celery. Beets. Bell peppers. Black olives. Broccoli. Brussels sprouts. Cabbage. Carrots. Cauliflower. Cucumber. Eggplant. Green beans. Potatoes. Radishes. Spinach. Squash. Turnips. Zucchini. Mushrooms. Peas. Grains Oats. Rice. Bran. Oatmeal. Whole wheat bread. Meats and other proteins Beef. Fish and other seafood. Eggs. Nuts. Peanut butter. Pork. Poultry. Lamb. Garbanzo beans. Pinto beans. Dairy Whole or low-fat milk. American, mozzarella, mild cheddar, feta, ricotta, and cream cheeses. The items listed above may not be a complete list of foods and beverages you can eat. Contact a dietitian for more information. What foods should I avoid? You should avoid any foods that seem to trigger your symptoms. It is also a good idea to avoid foods that are most likely to cause symptoms in many people with IC. These include the following: Fruits Citrus fruits, including lemons, limes, oranges, and grapefruit. Cranberries. Strawberries.  Pineapple. Kiwi. Vegetables Chili peppers. Onions. Sauerkraut. Tomato and tomato products. Angie Fava. Grains You do not need to avoid any type of grain unless it triggers your symptoms. Meats and other proteins Precooked or cured meats, such as sausages or meat loaves. Soy products. Dairy Chocolate ice cream. Processed cheese. Yogurt. Beverages Alcohol. Chocolate drinks. Coffee. Cranberry juice. Carbonated drinks. Tea (black, green, or herbal). Tomato juice. Sports drinks. The items listed above may not be a complete list of foods and beverages you should avoid. Contact a dietitian for more information. Summary Many people with IC find that certain foods trigger their symptoms. Different foods may be problematic for different people. You may find it helpful to work with a dietitian to do an elimination diet and come up with an eating plan that is right for you. Plan your meals according to the results of your elimination diet. If you have not done an elimination diet, plan your meals using IC food lists. These lists tell you which foods are least and most likely to cause symptoms. The best diet for people with IC is a balanced diet that includes foods from all the food groups. Even if you have to avoid certain foods, there are still plenty of healthy choices in each group. This information is not intended to replace advice given to you by your health care provider. Make sure you discuss  any questions you have with your health care provider. Document Revised: 04/13/2021 Document Reviewed: 04/13/2021 Elsevier Patient Education  [place].  Interstitial Cystitis  Interstitial cystitis is inflammation of the bladder. This condition is also known as painful bladder syndrome. This may cause pain in the bladder area as well as a frequent and urgent need to urinate. The bladder is an organ that stores urine after the urine is made in the kidneys. The severity of interstitial cystitis can vary  from person to person. You may have flare-ups, and then your symptoms may go away for a while. For many people, it becomes a long-term (chronic) problem. What are the causes? The cause of this condition is not known. What increases the risk? The following factors may make you more likely to develop this condition: Being female. Having fibromyalgia. Having irritable bowel syndrome (IBS). Having endometriosis. Having chronic fatigue syndrome. This condition may be aggravated by: Stress. Smoking. Spicy foods. What are the signs or symptoms? Symptoms of interstitial cystitis vary, and they can change over time. Symptoms may include: Discomfort or pain in the bladder area, which is in the lower abdomen. Pain can range from mild to severe. The pain may change in intensity as the bladder fills with urine or as it empties. Pain in the pelvic area, between the hip bones. A constant urge to urinate. Frequent urination. Pain during urination. Pain during sex. Blood in the urine. Feeling tired (fatigue). For women, symptoms often get worse during menstruation. How is this diagnosed? This condition is diagnosed based on your symptoms, your medical history, and a physical exam. Your health care provider may need to rule out other conditions and may order other tests, such as: Urine tests. Cystoscopy. For this test, a tool similar to a very thin telescope is used to look into your bladder. Biopsy. This involves taking a sample of tissue from the bladder to be examined under a microscope. How is this treated? There is no cure for this condition, but treatment can help you control your symptoms. Work closely with your health care provider to find the most effective treatments for you. Treatment options may include: Medicines to relieve pain and reduce how often you feel the need to urinate. This treatment may include: A procedure where a small amount of medicine that eases irritation is put inside  your bladder through a catheter (bladder instillation). Lifestyle changes, such as changing your diet or taking steps to control stress. Physical therapy. This may include: Exercises to help relax the pelvic floor muscles. Massage to relax tight muscles (myofascial release). Learning ways to control when you urinate (bladder training). Using a device that provides electrical stimulation to your nerves, which can relieve pain (neuromodulation therapy). The device is placed on your back, where it blocks the nerves that cause you to feel pain in your bladder area. A procedure that stretches your bladder by filling it with air or fluid (hydrodistention). Surgery. This is rare. It is only done for extreme cases, if other treatments do not help. Follow these instructions at home: Lifestyle Learn and practice relaxation techniques, such as deep breathing and muscle relaxation. Get care for your body and mental well-being, such as: Cognitive behavioral therapy (CBT). This therapy changes the way you think or act in response to different situations. This may improve how you feel. Seeing a mental health therapist to evaluate and treat depression, if necessary. Work with your health care provider on other ways to manage pain. Acupuncture may be  helpful. Avoid drinking alcohol. Do not use any products that contain nicotine or tobacco. These products include cigarettes, chewing tobacco, and vaping devices, such as e-cigarettes. If you need help quitting, ask your health care provider. Eating and drinking Make dietary changes as recommended by your health care provider. You may need to avoid: Spicy foods. Foods that contain a lot of potassium. Limit your intake of drinks that increase your urge to urinate. These include alcohol and caffeinated drinks like soda, coffee, and tea. Bladder training  Use bladder training techniques as directed. Techniques may include: Urinating at scheduled times. Training  yourself to delay urination. Keep a bladder diary. Write down the times you urinate and any symptoms that you have. This can help you find out which foods, liquids, or activities make your symptoms worse. Use your bladder diary to schedule bathroom trips. If you are away from home, plan to be near a bathroom at each of your scheduled times. Make sure that you urinate just before you leave the house and just before you go to bed. General instructions Take over-the-counter and prescription medicines only as told by your health care provider. Try a warm or cool compress over your bladder for comfort. Avoid wearing tight clothing. Do exercises to relax your pelvic floor muscles as told by your physical therapist. Keep all follow-up visits. This is important. Where to find more information To find more information or a support group near you, visit: [facility]: urologyhealth.org Interstitial Cystitis Association: ClassPreviews.com.br Contact a health care provider if you have: Symptoms that do not get better with treatment. Pain or discomfort that gets worse. More frequent urges to urinate. A fever. Get help right away if: You have no control over when you urinate. Summary Interstitial cystitis is inflammation of the bladder. This condition may cause pain in the bladder area as well as a frequent and urgent need to urinate. You may have flare-ups of the condition, and then it may go away for a while. For many people, it becomes a long-term (chronic) problem. There is no cure for interstitial cystitis, but treatment methods are available to control your symptoms. This information is not intended to replace advice given to you by your health care provider. Make sure you discuss any questions you have with your health care provider. Document Revised: 10/13/2019 Document Reviewed: 10/13/2019 Elsevier Patient Education  [place].

## 2022-06-10 ENCOUNTER — Telehealth: Payer: Self-pay | Admitting: *Deleted

## 2022-06-10 LAB — URINE CULTURE

## 2022-06-10 NOTE — Telephone Encounter (Signed)
.  left message to have patient return my call.  

## 2022-06-10 NOTE — Telephone Encounter (Signed)
-----   Message from Gordy Clement, [place] sent at 06/10/2022  3:54 PM EDT ----- Regarding: FW: UA results  ----- Message ----- From: Billey Co, MD Sent: 06/10/2022   2:28 PM EDT To: Gordy Clement, CMA Subject: UA results                                     Her urinalysis from clinic this week did show some microscopic hematuria.  Cultures are pending, and if these are negative I would recommend a CT to rule out kidney stone or other cause of her symptoms.  We will follow-up with those culture results when they return in the next week  Nickolas Madrid, MD 06/10/2022

## 2022-06-12 ENCOUNTER — Telehealth: Payer: BC Managed Care – PPO | Admitting: Internal Medicine

## 2022-06-12 ENCOUNTER — Encounter: Payer: Self-pay | Admitting: Internal Medicine

## 2022-06-12 VITALS — Temp 98.9°F | Wt 222.0 lb

## 2022-06-12 DIAGNOSIS — U071 COVID-19: Secondary | ICD-10-CM

## 2022-06-12 HISTORY — DX: COVID-19: U07.1

## 2022-06-12 MED ORDER — HYDROCOD POLI-CHLORPHE POLI ER 10-8 MG/5ML PO SUER: 5.0000 mL | Freq: Every evening | ORAL | 0 refills | Status: DC | PRN

## 2022-06-12 NOTE — Telephone Encounter (Signed)
Patient has been scheduled

## 2022-06-12 NOTE — Progress Notes (Signed)
   Subjective:    Patient ID: Jillian Contreras, female    DOB: [birth year], 37 y.o.   MRN: [id number]  HPI Video virtual visit due to COVID infection Identification done Reviewed limitations and billing and she gave consent Participants--patient in her home and I am in my office  Started getting sick 2 days ago Sore throat started 3 days ago---then headache and "serious brain fog" Felt warm--low grade fever Hips were aching--then brief chest and back burning Severe cough--actually vomited after Throat still raw No SOB  Tried theraflu, tylenol sinus, delsym (didn't help cough)  Current Outpatient Medications on File Prior to Visit  Medication Sig Dispense Refill   omeprazole (PRILOSEC) 20 MG capsule      celecoxib (CELEBREX) 200 MG capsule Take 1 capsule (200 mg total) by mouth 2 (two) times daily. (Patient not taking: Reported on 06/12/2022) 20 capsule 0   No current facility-administered medications on file prior to visit.    No Known Allergies  Past Medical History:  Diagnosis Date   Anxiety    Chest pain    Exposure to COVID-19 virus 03/03/2019   Hypokalemia    Left upper quadrant abdominal pain 06/22/2019   Palpitations    Palpitations    Panic attack    Panic attacks 12/27/2014   Thrush 08/14/2020    Past Surgical History:  Procedure Laterality Date   CESAREAN SECTION      Family History  Problem Relation Age of Onset   Cancer Paternal Aunt 54       breast   Breast cancer Paternal Aunt    Breast cancer Paternal Grandmother    Breast cancer Paternal Aunt    Breast cancer Paternal Aunt     Social History   Socioeconomic History   Marital status: Single    Spouse name: Not on file   Number of children: Not on file   Years of education: Not on file   Highest education level: Not on file  Occupational History   Occupation: customer service    Comment: Industrial/product designer  Tobacco Use   Smoking status: Never    Passive exposure: Never   Smokeless  tobacco: Never  Substance and Sexual Activity   Alcohol use: No    Alcohol/week: 0.0 standard drinks of alcohol   Drug use: No   Sexual activity: Not on file  Other Topics Concern   Not on file  Social History Narrative   Not on file   Social Determinants of Health   Financial Resource Strain: Not on file  Food Insecurity: Not on file  Transportation Needs: Not on file  Physical Activity: Not on file  Stress: Not on file  Social Connections: Not on file  Intimate Partner Violence: Not on file   Review of Systems No change in smell or taste Able to eat --but not much appetite    Objective:   Physical Exam Constitutional:      Appearance: Normal appearance.  Pulmonary:     Effort: Pulmonary effort is normal. No respiratory distress.  Neurological:     Mental Status: She is alert.            Assessment & Plan:

## 2022-06-12 NOTE — Assessment & Plan Note (Addendum)
Had bad first day--not as bad since then Discussed antivirals---will hold off on paxlovid Night cough is worst thing--will Rx tussionex which she has tolerated Analgesics Discussed quarantine and masking

## 2022-06-12 NOTE — Telephone Encounter (Signed)
Spoke with patient and advised results, pt also stated that she had just started her menstrual cycle when she gave that urine.

## 2022-06-12 NOTE — Telephone Encounter (Signed)
Can we get her set up with someone? Maybe a virtual visit since she's not feeling well?

## 2022-06-15 NOTE — Telephone Encounter (Signed)
Spoke with patient and advised results   

## 2022-06-16 LAB — MISC LABCORP TEST (SEND OUT): Labcorp test code: 86884

## 2022-06-23 ENCOUNTER — Telehealth (INDEPENDENT_AMBULATORY_CARE_PROVIDER_SITE_OTHER): Payer: BC Managed Care – PPO | Admitting: Primary Care

## 2022-06-23 ENCOUNTER — Encounter: Payer: Self-pay | Admitting: Primary Care

## 2022-06-23 VITALS — Ht 65.0 in | Wt 217.0 lb

## 2022-06-23 DIAGNOSIS — B029 Zoster without complications: Secondary | ICD-10-CM | POA: Insufficient documentation

## 2022-06-23 HISTORY — DX: Zoster without complications: B02.9

## 2022-06-23 MED ORDER — VALACYCLOVIR HCL 1 G PO TABS: 1000.0000 mg | ORAL_TABLET | Freq: Three times a day (TID) | ORAL | 0 refills | Status: DC

## 2022-06-23 NOTE — Telephone Encounter (Signed)
Called and spoke with patient. Pt scheduled for 3:40pm

## 2022-06-23 NOTE — Patient Instructions (Signed)
Start Valtrex 1 g three times daily for 7 days.  We will see you next month!  It was a pleasure to see you today!

## 2022-06-23 NOTE — Telephone Encounter (Signed)
Please have her scheduled for a virtual visit for this afternoon at 3:40 pm.

## 2022-06-23 NOTE — Progress Notes (Signed)
Patient ID: Jillian Contreras, female    DOB: May 04, 1985, 37 y.o.   MRN: [id number]  Virtual visit completed through [employer], a video enabled telemedicine application. Due to national recommendations of social distancing due to COVID-19, a virtual visit is felt to be most appropriate for this patient at this time. Reviewed limitations, risks, security and privacy concerns of performing a virtual visit and the availability of in person appointments. I also reviewed that there may be a patient responsible charge related to this service. The patient agreed to proceed.   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, office Persons participating in this virtual visit: patient, provider   If any vitals were documented, they were collected by patient at home unless specified below.    Ht 5\' 5"  (1.651 m)   Wt 217 lb (98.4 kg)   LMP 06/05/2022 (Exact Date)   BMI 36.11 kg/m    CC: Rash Subjective:   HPI: Jillian Contreras is a 37 y.o. female presenting on 06/23/2022 for Rash (Rash on back of neck)  Recent history of Covid-19 infection two weeks ago, evaluated by Dr. Silvio Pate, no antiviral treatment was provided at this time.   She contacted [place] this morning via MyChart with reports of a burning type rash to the back of her neck.  Today she mentions she's feeling better from Covid-19 infection. She does have residual drainage and cough that are improving. Her rash began 11 day ago with small red bumps to her posterior neck. She then developed burning and itching to the site of the rash two evenings ago with night sweats.   She has been recently traveling, stayed in a clean hotel for one night. She denies full body itching. No one else in her home has the rash and itching. She's applied lotion to the site of the rash. The rash is somewhat improved in terms of burning.        Relevant past medical, surgical, family and social history reviewed and updated as indicated. Interim  medical history since our last visit reviewed. Allergies and medications reviewed and updated. Outpatient Medications Prior to Visit  Medication Sig Dispense Refill   celecoxib (CELEBREX) 200 MG capsule Take 1 capsule (200 mg total) by mouth 2 (two) times daily. (Patient not taking: Reported on 06/12/2022) 20 capsule 0   chlorpheniramine-HYDROcodone (TUSSIONEX) 10-8 MG/5ML Take 5 mLs by mouth at bedtime as needed for cough. (Patient not taking: Reported on 06/23/2022) 70 mL 0   omeprazole (PRILOSEC) 20 MG capsule      No facility-administered medications prior to visit.     Per HPI unless specifically indicated in ROS section below Review of Systems  Respiratory:  Negative for shortness of breath.   Cardiovascular:  Negative for chest pain.  Skin:  Positive for color change and rash.   Objective:  Ht 5\' 5"  (1.651 m)   Wt 217 lb (98.4 kg)   LMP 06/05/2022 (Exact Date)   BMI 36.11 kg/m   Wt Readings from Last 3 Encounters:  06/23/22 217 lb (98.4 kg)  06/12/22 222 lb (100.7 kg)  06/09/22 222 lb (100.7 kg)       Physical exam: General: Alert and oriented x 3, no distress, does not appear sickly  Pulmonary: Speaks in complete sentences without increased work of breathing, no cough during visit.  Psychiatric: Normal mood, thought content, and behavior.  Skin: Rash to posterior neck with dried, hyperpigmented  vesicles     Results for orders  placed or performed during the hospital encounter of 06/09/22  Urine Culture   Specimen: Urine, Random  Result Value Ref Range   Specimen Description      URINE, RANDOM Performed at [facility], [address], [place], [place] [address]    Special Requests      NONE Performed at [facility], [address] [place], [place] [address]    Culture MULTIPLE SPECIES PRESENT, SUGGEST RECOLLECTION (A)    Report Status 06/10/2022 FINAL   Urinalysis, Complete w Microscopic -  Result Value Ref Range   Color, Urine  YELLOW YELLOW   APPearance CLEAR CLEAR   Specific Gravity, Urine 1.010 1.005 - 1.030   pH 6.5 5.0 - 8.0   Glucose, UA NEGATIVE NEGATIVE mg/dL   Hgb urine dipstick LARGE (A) NEGATIVE   Bilirubin Urine NEGATIVE NEGATIVE   Ketones, ur NEGATIVE NEGATIVE mg/dL   Protein, ur NEGATIVE NEGATIVE mg/dL   Nitrite NEGATIVE NEGATIVE   Leukocytes,Ua TRACE (A) NEGATIVE   Squamous Epithelial / HPF 0-5 0 - 5 /HPF   WBC, UA 0-5 0 - 5 WBC/hpf   RBC / HPF 11-20 0 - 5 RBC/hpf   Bacteria, UA RARE (A) NONE SEEN  Miscellaneous [facility] test (send-out)  Result Value Ref Range   [facility] test code [phone]    [facility] test name UREAPLASMA/MYCOPLASMA CULTURE    Source ([facility]) URINE, CLEAN CATCH    Misc [facility] result COMMENT    Assessment & Plan:   Problem List Items Addressed This Visit       Nervous and Auditory   Herpes zoster - Primary    Symptoms and pictures representative of herpes zoster.   Discussed that treatment may not be as effective since symptoms began 11 days ago.  She would like to proceed with treatment.   Start Valtrex 1 g TID x 7 days.   Follow up as needed.      Relevant Medications   valACYclovir (VALTREX) 1000 MG tablet     Meds ordered this encounter  Medications   valACYclovir (VALTREX) 1000 MG tablet    Sig: Take 1 tablet (1,000 mg total) by mouth 3 (three) times daily.    Dispense:  21 tablet    Refill:  0    Order Specific Question:   Supervising Provider    Answer:   BEDSOLE, AMY E [[id number]]   No orders of the defined types were placed in this encounter.   I discussed the assessment and treatment plan with the patient. The patient was provided an opportunity to ask questions and all were answered. The patient agreed with the plan and demonstrated an understanding of the instructions. The patient was advised to call back or seek an in-person evaluation if the symptoms worsen or if the condition fails to improve as anticipated.  Follow up plan:  Start Valtrex 1 g  three times daily for 7 days.  We will see you next month!  It was a pleasure to see you today!   Pleas Koch, NP

## 2022-06-23 NOTE — Assessment & Plan Note (Signed)
Symptoms and pictures representative of herpes zoster.   Discussed that treatment may not be as effective since symptoms began 11 days ago.  She would like to proceed with treatment.   Start Valtrex 1 g TID x 7 days.   Follow up as needed.

## 2022-07-21 ENCOUNTER — Ambulatory Visit: Payer: Self-pay | Admitting: Physician Assistant

## 2022-10-22 ENCOUNTER — Ambulatory Visit: Payer: PRIVATE HEALTH INSURANCE | Admitting: Internal Medicine

## 2022-10-22 ENCOUNTER — Encounter: Payer: Self-pay | Admitting: Internal Medicine

## 2022-10-22 VITALS — BP 110/70 | HR 85 | Temp 97.8°F | Ht 65.0 in | Wt 223.8 lb

## 2022-10-22 DIAGNOSIS — J0100 Acute maxillary sinusitis, unspecified: Secondary | ICD-10-CM | POA: Diagnosis not present

## 2022-10-22 DIAGNOSIS — J019 Acute sinusitis, unspecified: Secondary | ICD-10-CM | POA: Insufficient documentation

## 2022-10-22 MED ORDER — PREDNISONE 10 MG PO TABS: ORAL_TABLET | ORAL | 0 refills | Status: DC

## 2022-10-22 MED ORDER — AZITHROMYCIN 500 MG PO TABS: 500.0000 mg | ORAL_TABLET | Freq: Every day | ORAL | 0 refills | Status: DC

## 2022-10-22 MED ORDER — HYDROCOD POLI-CHLORPHE POLI ER 10-8 MG/5ML PO SUER: 5.0000 mL | Freq: Every evening | ORAL | 0 refills | Status: DC | PRN

## 2022-10-22 NOTE — Assessment & Plan Note (Signed)
Likely from COVID or other viral infection.  Supportive care and azithromycin prescribed   restesting for COVID advised.  Probiotic advised

## 2022-10-22 NOTE — Progress Notes (Signed)
Subjective:  Patient ID: Jillian Contreras, female    DOB: 1985-09-17  Age: 37 y.o. MRN: [id number]  CC: The encounter diagnosis was Acute non-recurrent maxillary sinusitis.   HPI Jillian Contreras presents for  Chief Complaint  Patient presents with   Sore Throat    Pt stated her symptoms started on Monday. Pt c/o sore throat, congestion, left side neck tenderness. Previously had body aches which have subsided. Pt did self test covid which was neg.     37 yr old female presents with  endorsing sore throat, tender cervical LAD., and left sided facial /ear fullness .     Sympomts started on July 29 with S/t and headache o.  On July 30 developed body aches  and cough.  COVID negative test on July 30.  No fevers  body aches have resolved  still with left sided sore throat, left sided sinus congestion and left ear fullness.     Outpatient Medications Prior to Visit  Medication Sig Dispense Refill   celecoxib (CELEBREX) 200 MG capsule Take 1 capsule (200 mg total) by mouth 2 (two) times daily. (Patient not taking: Reported on 06/12/2022) 20 capsule 0   omeprazole (PRILOSEC) 20 MG capsule  (Patient not taking: Reported on 10/22/2022)     valACYclovir (VALTREX) 1000 MG tablet Take 1 tablet (1,000 mg total) by mouth 3 (three) times daily. (Patient not taking: Reported on 10/22/2022) 21 tablet 0   chlorpheniramine-HYDROcodone (TUSSIONEX) 10-8 MG/5ML Take 5 mLs by mouth at bedtime as needed for cough. (Patient not taking: Reported on 06/23/2022) 70 mL 0   No facility-administered medications prior to visit.    Review of Systems;  Patient denies unintentional weight loss, skin rash, eye pain,  dysphagia,  hemoptysis ,  dyspnea, wheezing, chest pain, palpitations, orthopnea, edema, abdominal pain, nausea, melena, diarrhea, constipation, flank pain, dysuria, hematuria, urinary  Frequency, nocturia, numbness, tingling, seizures,  Focal weakness, Loss of consciousness,  Tremor, insomnia, depression, anxiety,  and suicidal ideation.      Objective:  BP 110/70   Pulse 85   Temp 97.8 F (36.6 C) (Oral)   Ht 5\' 5"  (1.651 m)   Wt 223 lb 12.8 oz (101.5 kg)   SpO2 96%   BMI 37.24 kg/m   BP Readings from Last 3 Encounters:  10/22/22 110/70  06/09/22 108/68  05/22/22 128/76    Wt Readings from Last 3 Encounters:  10/22/22 223 lb 12.8 oz (101.5 kg)  06/23/22 217 lb (98.4 kg)  06/12/22 222 lb (100.7 kg)    Physical Exam Vitals reviewed.  Constitutional:      General: She is not in acute distress.    Appearance: Normal appearance. She is normal weight. She is not ill-appearing, toxic-appearing or diaphoretic.  HENT:     Head: Normocephalic.     Left Ear: A middle ear effusion is present.  Eyes:     General: No scleral icterus.       Right eye: No discharge.        Left eye: No discharge.     Conjunctiva/sclera: Conjunctivae normal.  Cardiovascular:     Rate and Rhythm: Normal rate and regular rhythm.     Heart sounds: Normal heart sounds.  Musculoskeletal:        General: Normal range of motion.  Lymphadenopathy:     Cervical: Cervical adenopathy present.  Skin:    General: Skin is warm and dry.  Neurological:     General: No focal deficit present.  Mental Status: She is alert and oriented to person, place, and time. Mental status is at baseline.  Psychiatric:        Mood and Affect: Mood normal.        Behavior: Behavior normal.        Thought Content: Thought content normal.        Judgment: Judgment normal.    Lab Results  Component Value Date   HGBA1C 7.0 (H) 05/13/2022   HGBA1C 6.4 02/26/2021   HGBA1C 6.2 (A) 09/17/2020    Lab Results  Component Value Date   CREATININE 0.78 05/13/2022   CREATININE 0.67 02/26/2021   CREATININE 0.79 02/13/2020    Lab Results  Component Value Date   WBC 6.0 05/13/2022   HGB 11.9 (L) 05/13/2022   HCT 36.0 05/13/2022   PLT 344.0 05/13/2022   GLUCOSE 121 (H) 05/13/2022   CHOL 230 (H) 05/13/2022   TRIG 143.0 05/13/2022    HDL 42.10 05/13/2022   LDLDIRECT 149.6 08/20/2011   LDLCALC 159 (H) 05/13/2022   ALT 21 05/13/2022   AST 18 05/13/2022   NA 139 05/13/2022   K 4.1 05/13/2022   CL 105 05/13/2022   CREATININE 0.78 05/13/2022   BUN 9 05/13/2022   CO2 24 05/13/2022   TSH 2.92 03/13/2019   HGBA1C 7.0 (H) 05/13/2022    No results found.  Assessment & Plan:  .Acute non-recurrent maxillary sinusitis Assessment & Plan: Likely from COVID or other viral infection.  Supportive care and azithromycin prescribed   restesting for COVID advised.  Probiotic advised   Other orders -     predniSONE; 6 tablets on Day 1 , then reduce by 1 tablet daily until gone  Dispense: 21 tablet; Refill: 0 -     Azithromycin; Take 1 tablet (500 mg total) by mouth daily.  Dispense: 7 tablet; Refill: 0 -     Hydrocod Poli-Chlorphe Poli ER; Take 5 mLs by mouth at bedtime as needed for cough.  Dispense: 70 mL; Refill: 0    Follow-up: No follow-ups on file.   Sherlene Shams, MD

## 2022-10-22 NOTE — Patient Instructions (Addendum)
Your current infection is viral and still may be due to COVID.  Retest today at home  If positive,  your 5 day quarantine ends Friday night.  Mask until Saturday.  Prednisone, azithromycin and tussionex have been sent to your pharmacy to treat the symptoms including the left sinus and ear fullness.   Tussionex has an antihistamine in it to help dry up the post nasal drip. You can use zyrtec, claritin or allegra for the daytime symptoms and add sudafed PE if needed for sinus congestion   Gargle with salt water a couple times daily for the sore throat and eat popsicles   Daily use of a probiotic advised for 3 weeks.

## 2023-03-03 ENCOUNTER — Ambulatory Visit (INDEPENDENT_AMBULATORY_CARE_PROVIDER_SITE_OTHER): Payer: PRIVATE HEALTH INSURANCE | Admitting: General Practice

## 2023-03-03 ENCOUNTER — Ambulatory Visit (INDEPENDENT_AMBULATORY_CARE_PROVIDER_SITE_OTHER)
Admission: RE | Admit: 2023-03-03 | Discharge: 2023-03-03 | Disposition: A | Discharge status: Home / Self Care | Payer: PRIVATE HEALTH INSURANCE | Source: Ambulatory Visit | Attending: General Practice | Admitting: General Practice

## 2023-03-03 ENCOUNTER — Encounter: Payer: Self-pay | Admitting: General Practice

## 2023-03-03 VITALS — BP 120/84 | HR 70 | Temp 98.2°F | Ht 65.0 in | Wt 234.0 lb

## 2023-03-03 DIAGNOSIS — M545 Low back pain, unspecified: Secondary | ICD-10-CM

## 2023-03-03 MED ORDER — CYCLOBENZAPRINE HCL 5 MG PO TABS: 5.0000 mg | ORAL_TABLET | Freq: Three times a day (TID) | ORAL | 0 refills | Status: DC | PRN

## 2023-03-03 NOTE — Patient Instructions (Signed)
Complete xray(s) prior to leaving today. I will notify you of your results once received.  Start cyclobenzaperine 5 mg three times a day as needed. It can make you drowsy.   Please update if your symptoms worse or do not improve.

## 2023-03-03 NOTE — Assessment & Plan Note (Addendum)
Unclear etiology. Exam stable.  STAT lumbar complete x-ray ordered. X-ray reviewed from 2013. Differentials include muscle strain, muscle spasms, spinal stenosis, disc herniation.   Start cyclobenzaprine 5 mg TID PRN. Discussed side effects with patient. Verbalizes understanding.   Continue heat, aleve as needed.   Follow up with PCP if symptoms worsen or do not improve.

## 2023-03-03 NOTE — Progress Notes (Signed)
Established Patient Office Visit  Subjective   Patient ID: Jillian Contreras, female    DOB: 27-Nov-1985  Age: 37 y.o. MRN: [id number]  Chief Complaint  Patient presents with   Back Pain    Left lower back and goes down into hip; some numbness in left foot. Patient states it started about a week before thanksgiving. Patient did not have a fall or injury and thinks this may be due to her mattress. Has tried lidocaine patches OTC but did not help, took aleve yesterday and helped some. She has also tried rubbing biofreeze on. Worse when sitting and fine when laying down or standing.     Back Pain Pertinent negatives include no chest pain.    Back pain: started three weeks ago. No falls, no trauma or injury. She has a history of pleursy and this feels different.  left foot numbness with pain in her calf and behind her knee which started a few days.   Back Pain: Patient presents for presents evaluation of low back problems. Symptoms have been present for several weeks and include numbness in right foot, pain in calf and behind the knee (sharp, shooting, stabbing, throbbing, and tight in character; 7/10 in severity), paresthesias in none, stiffness in back, and weakness in both legs . Initial inciting event: none. Symptoms are worst during the day at work. Alleviating factors identifiable by patient are sitting. Exacerbating factors identifiable by patient are standing.  Previous lower back problems: none. Previous workup: none. Previous treatments: none. She did get a new mattress a year ago. She is concerned that it could be causing it. No urinray or stool incontinence. No UTI symptoms. Pain is alleviated when her back is straight and aggravated when she changes her posture. She has tried heating pad, Aleve, Biofreeze and lidocaine patches which seem to be helping. Few days ago, she noticed some left foot numbness with pain in her calf and behind her knee. She denies any urinary symptoms, urinary or  stool incontinence.  Patient Active Problem List   Diagnosis Date Noted   Sinusitis, acute maxillary 10/22/2022   Herpes zoster 06/23/2022   COVID-19 virus infection 06/12/2022   Dysuria 05/22/2022   Tongue abnormality 09/18/2020   Prediabetes 05/31/2019   Hyperlipidemia 05/31/2019   Preventative health care 09/29/2017   Anemia, iron deficiency 09/29/2016   GERD (gastroesophageal reflux disease) 05/08/2015   Acute left-sided low back pain without sciatica 12/27/2014   Menorrhagia 09/11/2013   Urinary frequency 12/12/2010   Past Medical History:  Diagnosis Date   Anxiety    Chest pain    Exposure to COVID-19 virus 03/03/2019   Hypokalemia    Left upper quadrant abdominal pain 06/22/2019   Palpitations    Palpitations    Panic attack    Panic attacks 12/27/2014   Thrush 08/14/2020   No Known Allergies       03/03/2023   11:03 AM 06/23/2022    1:08 PM 05/22/2022    2:16 PM  Depression screen PHQ 2/9  Decreased Interest 0 0 0  Down, Depressed, Hopeless 0 0 0  PHQ - 2 Score 0 0 0  Altered sleeping 0 0 0  Tired, decreased energy 0 0 0  Change in appetite 0 0 0  Feeling bad or failure about yourself  0 0 0  Trouble concentrating 0 0 0  Moving slowly or fidgety/restless 0 0 0  Suicidal thoughts 0 0 0  PHQ-9 Score 0 0 0  Difficult doing work/chores Not  difficult at all Not difficult at all Not difficult at all       03/03/2023   11:03 AM 05/22/2022    2:21 PM  GAD 7 : Generalized Anxiety Score  Nervous, Anxious, on Edge 0 0  Control/stop worrying 0 0  Worry too much - different things 0 0  Trouble relaxing 0 0  Restless 0 0  Easily annoyed or irritable 0 0  Afraid - awful might happen 0 0  Total GAD 7 Score 0 0  Anxiety Difficulty Not difficult at all Not difficult at all      Review of Systems  Cardiovascular:  Negative for chest pain, palpitations and leg swelling.  Musculoskeletal:  Positive for back pain.  Neurological:        Numbness in left leg starting  from knee radiating to left foot.      Objective:     BP 120/84 (BP Location: Left Arm, Patient Position: Sitting, Cuff Size: Large)   Pulse 70   Temp 98.2 F (36.8 C) (Oral)   Ht 5\' 5"  (1.651 m)   Wt 234 lb (106.1 kg)   LMP 02/11/2023 Comment: Pt denies chance of pregnancy  SpO2 97%   BMI 38.94 kg/m  BP Readings from Last 3 Encounters:  03/03/23 120/84  10/22/22 110/70  06/09/22 108/68   Wt Readings from Last 3 Encounters:  03/03/23 234 lb (106.1 kg)  10/22/22 223 lb 12.8 oz (101.5 kg)  06/23/22 217 lb (98.4 kg)      Physical Exam Vitals and nursing note reviewed.  Constitutional:      Appearance: Normal appearance.  Eyes:     Conjunctiva/sclera: Conjunctivae normal.  Cardiovascular:     Rate and Rhythm: Normal rate and regular rhythm.     Pulses: Normal pulses.     Heart sounds: Normal heart sounds.  Pulmonary:     Effort: Pulmonary effort is normal.     Breath sounds: Normal breath sounds.  Musculoskeletal:        General: Tenderness present. No swelling, deformity or signs of injury.     Right lower leg: No swelling. No edema.     Left lower leg: No swelling. No edema.     Right ankle: No swelling. Normal range of motion.     Left ankle: No swelling. Normal range of motion.     Right foot: Normal range of motion. No swelling.     Left foot: Normal range of motion. No swelling.  Neurological:     General: No focal deficit present.     Mental Status: She is alert and oriented to person, place, and time.     Motor: No weakness.     Gait: Gait is intact.     Deep Tendon Reflexes:     Reflex Scores:      Patellar reflexes are 2+ on the right side and 2+ on the left side. Psychiatric:        Mood and Affect: Mood normal.        Behavior: Behavior normal.        Thought Content: Thought content normal.        Judgment: Judgment normal.      No results found for any visits on 03/03/23.     The ASCVD Risk score (Arnett DK, et al., 2019) failed to  calculate for the following reasons:   The 2019 ASCVD risk score is only valid for ages 61 to 81    Assessment & Plan:  Acute left-sided low back pain without sciatica Assessment & Plan: Unclear etiology. Exam stable.  STAT lumbar complete x-ray ordered. X-ray reviewed from 2013. Differentials include muscle strain, muscle spasms, spinal stenosis, disc herniation.   Start cyclobenzaprine 5 mg TID PRN. Discussed side effects with patient. Verbalizes understanding.   Continue heat, aleve as needed.   Follow up with PCP if symptoms worsen or do not improve.  Orders: -     DG Lumbar Spine Complete -     Cyclobenzaprine HCl; Take 1 tablet (5 mg total) by mouth 3 (three) times daily as needed for muscle spasms.  Dispense: 30 tablet; Refill: 0     Return if symptoms worsen or fail to improve.    Modesto Charon, NP

## 2023-03-08 ENCOUNTER — Encounter: Payer: Self-pay | Admitting: General Practice

## 2023-04-27 ENCOUNTER — Ambulatory Visit (INDEPENDENT_AMBULATORY_CARE_PROVIDER_SITE_OTHER): Payer: PRIVATE HEALTH INSURANCE | Admitting: Internal Medicine

## 2023-04-27 VITALS — BP 110/78 | HR 94 | Temp 98.9°F | Ht 65.0 in | Wt 230.0 lb

## 2023-04-27 DIAGNOSIS — J101 Influenza due to other identified influenza virus with other respiratory manifestations: Secondary | ICD-10-CM

## 2023-04-27 DIAGNOSIS — R509 Fever, unspecified: Secondary | ICD-10-CM | POA: Diagnosis not present

## 2023-04-27 HISTORY — DX: Influenza due to other identified influenza virus with other respiratory manifestations: J10.1

## 2023-04-27 LAB — POCT INFLUENZA A/B
Influenza A, POC: POSITIVE — AB
Influenza B, POC: NEGATIVE

## 2023-04-27 MED ORDER — BENZONATATE 200 MG PO CAPS: 200.0000 mg | ORAL_CAPSULE | Freq: Three times a day (TID) | ORAL | 0 refills | Status: DC | PRN

## 2023-04-27 MED ORDER — OSELTAMIVIR PHOSPHATE 75 MG PO CAPS: 75.0000 mg | ORAL_CAPSULE | Freq: Two times a day (BID) | ORAL | 0 refills | Status: DC

## 2023-04-27 NOTE — Assessment & Plan Note (Signed)
2 days in Discussed oseltamivir--and she would like to try Benzonatate for cough Tylenol or other analgesics Will need to be out of work this week ER for sig worsening like SOB

## 2023-04-27 NOTE — Progress Notes (Signed)
 Subjective:    Patient ID: Jillian Contreras, female    DOB: 1985-11-22, 38 y.o.   MRN: [id number]  HPI Here due to respiratory illness  Started 2 days ago Spent all day in bed for the last 2 days Aches, fever, cold chills, bad cough with creamy thick mucus Nasal congestion and runny nose since yesterday Maxillary pain and tooth pain Headache No SOB unless she is moving around a lot  Taking theraflu---some help for headache  Current Outpatient Medications on File Prior to Visit  Medication Sig Dispense Refill   cyclobenzaprine  (FLEXERIL ) 5 MG tablet Take 1 tablet (5 mg total) by mouth 3 (three) times daily as needed for muscle spasms. 30 tablet 0   No current facility-administered medications on file prior to visit.    No Known Allergies  Past Medical History:  Diagnosis Date   Anxiety    Chest pain    Exposure to COVID-19 virus 03/03/2019   Hypokalemia    Left upper quadrant abdominal pain 06/22/2019   Palpitations    Palpitations    Panic attack    Panic attacks 12/27/2014   Thrush 08/14/2020    Past Surgical History:  Procedure Laterality Date   CESAREAN SECTION      Family History  Problem Relation Age of Onset   Cancer Paternal Aunt 82       breast   Breast cancer Paternal Aunt    Breast cancer Paternal Grandmother    Breast cancer Paternal Aunt    Breast cancer Paternal Aunt     Social History   Socioeconomic History   Marital status: Single    Spouse name: Not on file   Number of children: Not on file   Years of education: Not on file   Highest education level: Some college, no degree  Occupational History   Occupation: customer service    Comment: administrator, civil service  Tobacco Use   Smoking status: Never    Passive exposure: Never   Smokeless tobacco: Never  Substance and Sexual Activity   Alcohol use: No    Alcohol/week: 0.0 standard drinks of alcohol   Drug use: No   Sexual activity: Not on file  Other Topics Concern   Not on file   Social History Narrative   Not on file   Social Drivers of Health   Financial Resource Strain: Low Risk  (04/27/2023)   Overall Financial Resource Strain (CARDIA)    Difficulty of Paying Living Expenses: Not hard at all  Food Insecurity: No Food Insecurity (04/27/2023)   Hunger Vital Sign    Worried About Running Out of Food in the Last Year: Never true    Ran Out of Food in the Last Year: Never true  Transportation Needs: No Transportation Needs (04/27/2023)   PRAPARE - Administrator, Civil Service (Medical): No    Lack of Transportation (Non-Medical): No  Physical Activity: Unknown (04/27/2023)   Exercise Vital Sign    Days of Exercise per Week: 0 days    Minutes of Exercise per Session: Not on file  Stress: No Stress Concern Present (04/27/2023)   [employer] of Occupational Health - Occupational Stress Questionnaire    Feeling of Stress : Not at all  Social Connections: Moderately Integrated (04/27/2023)   Social Connection and Isolation Panel [NHANES]    Frequency of Communication with Friends and Family: More than three times a week    Frequency of Social Gatherings with Friends and Family: More than three  times a week    Attends Religious Services: More than 4 times per year    Active Member of Clubs or Organizations: Yes    Attends Engineer, Structural: More than 4 times per year    Marital Status: Never married  Intimate Partner Violence: Not on file   Review of Systems Trouble sleeping--can't get comfortable Brief nausea--no vomiting Appetite gone--but able to eat    Objective:   Physical Exam Constitutional:      General: She is not in acute distress.    Appearance: Normal appearance.     Comments: Coarse cough  HENT:     Head:     Comments: No sinus tenderness    Right Ear: Tympanic membrane and ear canal normal.     Left Ear: Tympanic membrane and ear canal normal.     Mouth/Throat:     Pharynx: No oropharyngeal exudate or posterior  oropharyngeal erythema.  Pulmonary:     Effort: Pulmonary effort is normal.     Breath sounds: Normal breath sounds. No wheezing or rales.  Musculoskeletal:     Cervical back: Neck supple.  Lymphadenopathy:     Cervical: No cervical adenopathy.  Neurological:     Mental Status: She is alert.            Assessment & Plan:

## 2023-05-03 ENCOUNTER — Telehealth: Payer: Self-pay | Admitting: Primary Care

## 2023-05-03 MED ORDER — HYDROCOD POLI-CHLORPHE POLI ER 10-8 MG/5ML PO SUER: 5.0000 mL | Freq: Two times a day (BID) | ORAL | 0 refills | Status: DC | PRN

## 2023-05-03 NOTE — Telephone Encounter (Signed)
 Called patient and reviewed all information. Patient verbalized understanding. Will call if any further questions.

## 2023-05-03 NOTE — Addendum Note (Signed)
 Addended by: Curt Dover I on: 05/03/2023 02:13 PM   Modules accepted: Orders

## 2023-05-03 NOTE — Telephone Encounter (Signed)
 Copied from CRM [id number]. Topic: Clinical - Prescription Issue >> May 03, 2023 10:07 AM Jillian Contreras wrote: Reason for CRM: pt called to advise benzonatate  (TESSALON ) 200 MG capsule is not working, is requesting the provider to prescribe something else cough, congestion. Please call pt at [phone]

## 2023-05-03 NOTE — Telephone Encounter (Signed)
 Please let her know that I sent a refill of the narcotic cough syrup she has had in the past

## 2023-05-21 ENCOUNTER — Ambulatory Visit: Payer: PRIVATE HEALTH INSURANCE | Admitting: Primary Care

## 2023-05-21 ENCOUNTER — Encounter: Payer: Self-pay | Admitting: Primary Care

## 2023-05-21 VITALS — BP 112/64 | HR 85 | Temp 97.2°F | Ht 65.0 in | Wt 230.0 lb

## 2023-05-21 DIAGNOSIS — G8929 Other chronic pain: Secondary | ICD-10-CM

## 2023-05-21 DIAGNOSIS — M5442 Lumbago with sciatica, left side: Secondary | ICD-10-CM

## 2023-05-21 DIAGNOSIS — R1031 Right lower quadrant pain: Secondary | ICD-10-CM | POA: Diagnosis not present

## 2023-05-21 NOTE — Patient Instructions (Signed)
 You will receive a phone call regarding the ultrasound.  Let me know if you have not heard from someone within 1 week.  Please let me know if you would like to partake in physical therapy.  It was a pleasure to see you today!

## 2023-05-21 NOTE — Assessment & Plan Note (Signed)
 Seems to be aggravated by sedentary work today. No alarm signs during HPI or exam  Discussed the option for physical therapy, she declines for now. Reviewed plain films of the lumbar spine.  Agree to provide work note to allow for standing desk. She will update if symptoms do not improve.

## 2023-05-21 NOTE — Assessment & Plan Note (Signed)
 Exam today unremarkable. Symptoms seem hormonal, especially given the lack of symptoms during menses.  Transvaginal/pelvic ultrasound ordered and pending.  She agrees. Consider OCPs or some form of birth control.

## 2023-05-21 NOTE — Progress Notes (Signed)
 Subjective:    Patient ID: Jillian Contreras, female    DOB: Jan 15, 1986, 38 y.o.   MRN: [id number]  Back Pain Associated symptoms include numbness and pelvic pain. Pertinent negatives include no abdominal pain or dysuria.  Groin Pain The patient's primary symptoms include pelvic pain. Associated symptoms include back pain. Pertinent negatives include no abdominal pain, dysuria or flank pain.    Jillian Contreras is a very pleasant 38 y.o. female with a history of GERD, menorrhagia, acute left-sided lower back pain, iron deficiency anemia, prediabetes, hyperlipidemia who presents today to discuss back pain and groin pain.  1) Chronic Back Pain: Chronic for >6 months, intermittent and located to the left lower back and buttocks with radiation down to the left lateral calf. 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 She is requesting a full stand up desk so that she doesn't have to raise the actual desk. She is needing a work note for this.   She underwent xray of the lumbar spine which was negative. She tries to get up and walk but has limited time to get up from her desk due to the demands of the job. During her days off her back pain is improved.  She does experience occasional numbness down the left lower extremity.  Denies loss of bowel/bladder control.  2) Groin Pain: Chronic for the last 3 months, located to the right groin, intermittent. She notices the pain daily and constant except for the one week during menses, she has no pain during that week. She describes her pain as pulsating and sharp.   Menstrual cycles are heavy, less cramping, does have moderate headaches intermittently. Menses lasts up to 7 days, heavy for 3-4 days with medium to large clots. Her pain isn't worse with activity. She denies urinary symptoms.   Review of Systems  Gastrointestinal:  Negative for abdominal pain.  Genitourinary:  Positive for pelvic pain. Negative for  dysuria, flank pain and menstrual problem.  Musculoskeletal:  Positive for back pain.  Neurological:  Positive for numbness.         Past Medical History:  Diagnosis Date   Anxiety    Chest pain    COVID-19 virus infection 06/12/2022   Exposure to COVID-19 virus 03/03/2019   Hypokalemia    Influenza A 04/27/2023   Left upper quadrant abdominal pain 06/22/2019   Palpitations    Palpitations    Panic attack    Panic attacks 12/27/2014   Thrush 08/14/2020    Social History   Socioeconomic History   Marital status: Single    Spouse name: Not on file   Number of children: Not on file   Years of education: Not on file   Highest education level: Some college, no degree  Occupational History   Occupation: customer service    Comment: Administrator, [employer]  Tobacco Use   Smoking status: Never    Passive exposure: Never   Smokeless tobacco: Never  Substance and Sexual Activity   Alcohol use: No    Alcohol/week: 0.0 standard drinks of alcohol   Drug use: No   Sexual activity: Not on file  Other Topics Concern   Not on file  Social History Narrative   Not on file   Social Drivers of Health   Financial Resource Strain: Low Risk  (04/27/2023)   Overall Financial Resource Strain (CARDIA)    Difficulty of Paying Living Expenses: Not hard at all  Food Insecurity: No Food Insecurity (04/27/2023)   Hunger Vital Sign    Worried About Running Out of Food in the Last Year: Never true    Ran Out of Food in the Last Year: Never true  Transportation Needs: No Transportation Needs (04/27/2023)   PRAPARE - Administrator, Civil Service (Medical): No    Lack of Transportation (Non-Medical): No  Physical Activity: Unknown (04/27/2023)   Exercise Vital Sign    Days of Exercise per Week: 0 days    Minutes of Exercise per Session: Not on file  Stress: No Stress Concern Present (04/27/2023)   [employer] of Occupational Health - Occupational Stress Questionnaire    Feeling of  Stress : Not at all  Social Connections: Moderately Integrated (04/27/2023)   Social Connection and Isolation Panel [NHANES]    Frequency of Communication with Friends and Family: More than three times a week    Frequency of Social Gatherings with Friends and Family: More than three times a week    Attends Religious Services: More than 4 times per year    Active Member of [employer] or Organizations: Yes    Attends Engineer, structural: More than 4 times per year    Marital Status: Never married  Catering manager Violence: Not on file    Past Surgical History:  Procedure Laterality Date   CESAREAN SECTION      Family History  Problem Relation Age of Onset   Cancer Paternal Aunt 34       breast   Breast cancer Paternal Aunt    Breast cancer Paternal Grandmother    Breast cancer Paternal Aunt    Breast cancer Paternal Aunt     No Known Allergies  Current Outpatient Medications on File Prior to Visit  Medication Sig Dispense Refill   cyclobenzaprine (FLEXERIL) 5 MG tablet Take 1 tablet (5 mg total) by mouth 3 (three) times daily as needed for muscle spasms. (Patient not taking: Reported on 05/21/2023) 30 tablet 0   No current facility-administered medications on file prior to visit.    BP 112/64   Pulse 85   Temp (!) 97.2 F (36.2 C) (Temporal)   Ht 5\' 5"  (1.651 m)   Wt 230 lb (104.3 kg)   LMP 05/09/2023   SpO2 97%   BMI 38.27 kg/m  Objective:   Physical Exam Cardiovascular:     Rate and Rhythm: Normal rate.  Pulmonary:     Effort: Pulmonary effort is normal.  Abdominal:     Hernia: There is no hernia in the right inguinal area.  Musculoskeletal:     Cervical back: Neck supple.     Lumbar back: No tenderness or bony tenderness. Normal range of motion. Negative right straight leg raise test and negative left straight leg raise test.       Back:  Lymphadenopathy:     Lower Body: No right inguinal adenopathy.  Skin:    General: Skin is warm and dry.   Neurological:     Mental Status: She is alert and oriented to person, place, and time.  Psychiatric:        Mood and Affect: Mood normal.           Assessment & Plan:  Chronic left-sided low back pain with left-sided sciatica Assessment & Plan: Seems to be aggravated by sedentary work today. No alarm signs during HPI or exam  Discussed the option for physical therapy, she declines for now. Reviewed plain films of  the lumbar spine.  Agree to provide work note to allow for standing desk. She will update if symptoms do not improve.   Chronic groin pain, right Assessment & Plan: Exam today unremarkable. Symptoms seem hormonal, especially given the lack of symptoms during menses.  Transvaginal/pelvic ultrasound ordered and pending.  She agrees. Consider OCPs or some form of birth control.         Doreene Nest, NP

## 2023-06-07 DIAGNOSIS — G8929 Other chronic pain: Secondary | ICD-10-CM

## 2023-06-08 ENCOUNTER — Ambulatory Visit (INDEPENDENT_AMBULATORY_CARE_PROVIDER_SITE_OTHER): Payer: PRIVATE HEALTH INSURANCE | Admitting: Primary Care

## 2023-06-08 ENCOUNTER — Encounter: Payer: Self-pay | Admitting: Primary Care

## 2023-06-08 VITALS — BP 106/64 | HR 96 | Temp 97.5°F | Ht 65.0 in | Wt 233.0 lb

## 2023-06-08 DIAGNOSIS — R051 Acute cough: Secondary | ICD-10-CM | POA: Insufficient documentation

## 2023-06-08 MED ORDER — PREDNISONE 20 MG PO TABS: ORAL_TABLET | ORAL | 0 refills | Status: DC

## 2023-06-08 MED ORDER — OMEPRAZOLE 20 MG PO CPDR: 20.0000 mg | DELAYED_RELEASE_CAPSULE | Freq: Every day | ORAL | 0 refills | Status: DC

## 2023-06-08 NOTE — Telephone Encounter (Signed)
 Stat pelvic/transvaginal ultrasound ordered and pending.  Can you notify the stat team's pool?

## 2023-06-08 NOTE — Patient Instructions (Signed)
 Start omeprazole 20 mg capsules for cough.  Take 1 capsule by mouth every evening at bedtime.  Start prednisone 20 mg tablets. Take 2 tablets by mouth once daily in the morning for 5 days.  Please update me in 1 week.  It was a pleasure to see you today!

## 2023-06-08 NOTE — Assessment & Plan Note (Signed)
 Symptoms and presentation representative of reflux induced cough with a more recent viral infection.  Respiratory exam today overall benign.  Vital signs reassuring.  Start omeprazole 20 mg HS. Start prednisone 20 mg tablets. Take 2 tablets by mouth once daily in the morning for 5 days.  She will update in 1 week.

## 2023-06-08 NOTE — Progress Notes (Signed)
 Subjective:    Patient ID: Jillian Contreras, female    DOB: 1985/09/10, 38 y.o.   MRN: [id number]  Cough Associated symptoms include postnasal drip. Pertinent negatives include no chills, fever, headaches, sore throat or shortness of breath.    Jillian Contreras is a very pleasant 39 y.o. female with a history of GERD, prediabetes who presents today to discuss cough.  Symptom onset in early February 2025 with fever, chills, cough, body aches, maxillary pain, rhinorrhea.  Tested positive for influenza A.  Treated with Tamiflu and Tessalon Perles course.   Since her influenza diagnosis her cough never fully resolved. She's mostly kept a dry cough. Her other symptoms resolved. About four to five days ago she developed increased cough and post nasal drip. She has been in contact with several sick co-workers. Her cough is worse at night, when laughing. She will experience coughing spells at times with chest tightness.   She now denies fevers, chills, body aches, esophageal burning/reflux, shortness of breath. She's been taking Tussionex at night which helps temporarily, allows her to sleep. She has no prior history of asthma and is a non smoker.    Review of Systems  Constitutional:  Negative for chills, fatigue and fever.  HENT:  Positive for postnasal drip. Negative for congestion and sore throat.   Respiratory:  Positive for cough and chest tightness. Negative for shortness of breath.   Neurological:  Negative for headaches.         Past Medical History:  Diagnosis Date   Anxiety    Chest pain    COVID-19 virus infection 06/12/2022   Exposure to COVID-19 virus 03/03/2019   Hypokalemia    Influenza A 04/27/2023   Left upper quadrant abdominal pain 06/22/2019   Palpitations    Palpitations    Panic attack    Panic attacks 12/27/2014   Thrush 08/14/2020    Social History   Socioeconomic History   Marital status: Single    Spouse name: Not on file   Number of children: Not on  file   Years of education: Not on file   Highest education level: Some college, no degree  Occupational History   Occupation: customer service    Comment: Administrator, [employer]  Tobacco Use   Smoking status: Never    Passive exposure: Never   Smokeless tobacco: Never  Substance and Sexual Activity   Alcohol use: No    Alcohol/week: 0.0 standard drinks of alcohol   Drug use: No   Sexual activity: Not on file  Other Topics Concern   Not on file  Social History Narrative   Not on file   Social Drivers of Health   Financial Resource Strain: Low Risk  (04/27/2023)   Overall Financial Resource Strain (CARDIA)    Difficulty of Paying Living Expenses: Not hard at all  Food Insecurity: No Food Insecurity (04/27/2023)   Hunger Vital Sign    Worried About Running Out of Food in the Last Year: Never true    Ran Out of Food in the Last Year: Never true  Transportation Needs: No Transportation Needs (04/27/2023)   PRAPARE - Administrator, Civil Service (Medical): No    Lack of Transportation (Non-Medical): No  Physical Activity: Unknown (04/27/2023)   Exercise Vital Sign    Days of Exercise per Week: 0 days    Minutes of Exercise per Session: Not on file  Stress: No Stress Concern Present (04/27/2023)   [employer] of Occupational Health -  Occupational Stress Questionnaire    Feeling of Stress : Not at all  Social Connections: Moderately Integrated (04/27/2023)   Social Connection and Isolation Panel [NHANES]    Frequency of Communication with Friends and Family: More than three times a week    Frequency of Social Gatherings with Friends and Family: More than three times a week    Attends Religious Services: More than 4 times per year    Active Member of [employer] or Organizations: Yes    Attends Engineer, structural: More than 4 times per year    Marital Status: Never married  Catering manager Violence: Not on file    Past Surgical History:  Procedure Laterality Date    CESAREAN SECTION      Family History  Problem Relation Age of Onset   Cancer Paternal Aunt 34       breast   Breast cancer Paternal Aunt    Breast cancer Paternal Grandmother    Breast cancer Paternal Aunt    Breast cancer Paternal Aunt     No Known Allergies  Current Outpatient Medications on File Prior to Visit  Medication Sig Dispense Refill   cyclobenzaprine (FLEXERIL) 5 MG tablet Take 1 tablet (5 mg total) by mouth 3 (three) times daily as needed for muscle spasms. (Patient not taking: Reported on 06/08/2023) 30 tablet 0   No current facility-administered medications on file prior to visit.    BP 106/64   Pulse 96   Temp (!) 97.5 F (36.4 C) (Temporal)   Ht 5\' 5"  (1.651 m)   Wt 233 lb (105.7 kg)   LMP 06/08/2023   SpO2 97%   BMI 38.77 kg/m  Objective:   Physical Exam Constitutional:      Appearance: She is not ill-appearing.  HENT:     Right Ear: Tympanic membrane and ear canal normal.     Left Ear: Tympanic membrane and ear canal normal.     Nose: No mucosal edema.     Right Sinus: No maxillary sinus tenderness or frontal sinus tenderness.     Left Sinus: No maxillary sinus tenderness or frontal sinus tenderness.     Mouth/Throat:     Mouth: Mucous membranes are moist.  Eyes:     Conjunctiva/sclera: Conjunctivae normal.  Cardiovascular:     Rate and Rhythm: Normal rate and regular rhythm.  Pulmonary:     Effort: Pulmonary effort is normal.     Breath sounds: Normal breath sounds. No wheezing or rhonchi.     Comments: Dry cough noted during visit several times Musculoskeletal:     Cervical back: Neck supple.  Skin:    General: Skin is warm and dry.           Assessment & Plan:  Acute cough Assessment & Plan: Symptoms and presentation representative of reflux induced cough with a more recent viral infection.  Respiratory exam today overall benign.  Vital signs reassuring.  Start omeprazole 20 mg HS. Start prednisone 20 mg tablets. Take 2  tablets by mouth once daily in the morning for 5 days.  She will update in 1 week.   Orders: -     predniSONE; Take 2 tablets by mouth once daily in the morning for 5 days.  Dispense: 10 tablet; Refill: 0 -     Omeprazole; Take 1 capsule (20 mg total) by mouth at bedtime. For cough  Dispense: 90 capsule; Refill: 0        Doreene Nest, NP

## 2023-06-08 NOTE — Telephone Encounter (Signed)
STAT team has been notified.

## 2023-06-22 ENCOUNTER — Ambulatory Visit
Admission: RE | Admit: 2023-06-22 | Discharge: 2023-06-22 | Disposition: A | Discharge status: Home / Self Care | Payer: PRIVATE HEALTH INSURANCE | Source: Ambulatory Visit | Attending: Primary Care | Admitting: Primary Care

## 2023-06-22 DIAGNOSIS — G8929 Other chronic pain: Secondary | ICD-10-CM

## 2023-08-24 ENCOUNTER — Encounter: Payer: Self-pay | Admitting: Primary Care

## 2023-08-24 ENCOUNTER — Ambulatory Visit (INDEPENDENT_AMBULATORY_CARE_PROVIDER_SITE_OTHER): Payer: PRIVATE HEALTH INSURANCE | Admitting: Primary Care

## 2023-08-24 VITALS — BP 118/70 | HR 84 | Temp 98.2°F | Ht 65.0 in | Wt 235.0 lb

## 2023-08-24 DIAGNOSIS — R519 Headache, unspecified: Secondary | ICD-10-CM | POA: Insufficient documentation

## 2023-08-24 DIAGNOSIS — E785 Hyperlipidemia, unspecified: Secondary | ICD-10-CM

## 2023-08-24 DIAGNOSIS — R7303 Prediabetes: Secondary | ICD-10-CM | POA: Diagnosis not present

## 2023-08-24 DIAGNOSIS — Z0001 Encounter for general adult medical examination with abnormal findings: Secondary | ICD-10-CM

## 2023-08-24 DIAGNOSIS — Z0000 Encounter for general adult medical examination without abnormal findings: Secondary | ICD-10-CM

## 2023-08-24 DIAGNOSIS — G43009 Migraine without aura, not intractable, without status migrainosus: Secondary | ICD-10-CM | POA: Diagnosis not present

## 2023-08-24 MED ORDER — PROPRANOLOL HCL ER 80 MG PO CP24: 80.0000 mg | ORAL_CAPSULE | Freq: Every day | ORAL | 0 refills | Status: DC

## 2023-08-24 MED ORDER — SUMATRIPTAN SUCCINATE 50 MG PO TABS: ORAL_TABLET | ORAL | 0 refills | Status: AC

## 2023-08-24 NOTE — Assessment & Plan Note (Signed)
 Less likely trigeminal neuralgia, will obtain sed rate and CBC.  Symptoms more likely migraines.   Discussed options for treatment.  Start propranolol ER 80 mg at bedtime for headache prevention. Start sumatriptan 50 mg as needed for migraine abortion.  Discussed instructions for use.  Drowsiness precautions provided.

## 2023-08-24 NOTE — Assessment & Plan Note (Signed)
 Chronic, frequent.  Discussed options for treatment.  Start propranolol ER 80 mg at bedtime for headache prevention. Start sumatriptan 50 mg as needed for migraine abortion.  Discussed instructions for use.  Drowsiness precautions provided.  Continue to monitor.

## 2023-08-24 NOTE — Progress Notes (Signed)
 Subjective:    Patient ID: Jillian Contreras, female    DOB: 09/03/1985, 38 y.o.   MRN: [id number]  HPI  Jillian Contreras is a very pleasant 38 y.o. female who presents today for complete physical and follow up of chronic conditions.  She would also like to discuss frequent headaches. Chronic for the last 6-12 months. Over the last 6-12 months she's developed  sharp shooting pain from the left occipital lobe up to the parietal and frontal lobes or right temporal pain with radiation to the right eye. Headaches occur 2-3 times weekly lasting for hours. During headaches she experiences photophobia and phonophobia, nausea. Has to wear sunglasses. She's been taking Excedrin Migraine for pain, sometimes doesn't take anything.   She met with her eye doctor last year, was told to ask about trigeminal neuralgia. Eye exam was stable, no new findings. She has a personal history of migraines that date back to the 5th grade. She has a family history of migraines in her months. Her menstrual cycles began changing a few years ago which is when she began noticing her headaches now. Headaches occur with and without menses. Her last headache was a few days ago. A few weeks ago she began noticing right eye twitching with her right sided headaches. Her twitching occurs at work only.   Immunizations: -Tetanus: Completed in 2022  Diet: Fair diet.  Exercise: No regular exercise.  Eye exam: Completes annually  Dental exam: Completed 1 year ago   Pap Smear: Completed in December 2022      Review of Systems  Constitutional:  Negative for unexpected weight change.  HENT:  Negative for rhinorrhea.   Eyes:  Positive for photophobia.  Respiratory:  Negative for cough and shortness of breath.   Cardiovascular:  Negative for chest pain.  Gastrointestinal:  Negative for constipation and diarrhea.  Genitourinary:  Negative for difficulty urinating and menstrual problem.  Musculoskeletal:  Negative for arthralgias  and myalgias.  Skin:  Negative for rash.  Allergic/Immunologic: Negative for environmental allergies.  Neurological:  Positive for headaches. Negative for dizziness and numbness.  Psychiatric/Behavioral:  The patient is not nervous/anxious.          Past Medical History:  Diagnosis Date   Anxiety    Chest pain    COVID-19 virus infection 06/12/2022   Exposure to COVID-19 virus 03/03/2019   Herpes zoster 06/23/2022   Hypokalemia    Influenza A 04/27/2023   Left upper quadrant abdominal pain 06/22/2019   Palpitations    Palpitations    Panic attack    Panic attacks 12/27/2014   Thrush 08/14/2020   Tongue abnormality 09/18/2020    Social History   Socioeconomic History   Marital status: Single    Spouse name: Not on file   Number of children: Not on file   Years of education: Not on file   Highest education level: Some college, no degree  Occupational History   Occupation: customer service    Comment: Administrator, [employer]  Tobacco Use   Smoking status: Never    Passive exposure: Never   Smokeless tobacco: Never  Substance and Sexual Activity   Alcohol use: No    Alcohol/week: 0.0 standard drinks of alcohol   Drug use: No   Sexual activity: Not on file  Other Topics Concern   Not on file  Social History Narrative   Not on file   Social Drivers of Health   Financial Resource Strain: Low Risk  (04/27/2023)  Overall Financial Resource Strain (CARDIA)    Difficulty of Paying Living Expenses: Not hard at all  Food Insecurity: No Food Insecurity (04/27/2023)   Hunger Vital Sign    Worried About Running Out of Food in the Last Year: Never true    Ran Out of Food in the Last Year: Never true  Transportation Needs: No Transportation Needs (04/27/2023)   PRAPARE - Administrator, Civil Service (Medical): No    Lack of Transportation (Non-Medical): No  Physical Activity: Unknown (04/27/2023)   Exercise Vital Sign    Days of Exercise per Week: 0 days    Minutes  of Exercise per Session: Not on file  Stress: No Stress Concern Present (04/27/2023)   [employer] of Occupational Health - Occupational Stress Questionnaire    Feeling of Stress : Not at all  Social Connections: Moderately Integrated (04/27/2023)   Social Connection and Isolation Panel [NHANES]    Frequency of Communication with Friends and Family: More than three times a week    Frequency of Social Gatherings with Friends and Family: More than three times a week    Attends Religious Services: More than 4 times per year    Active Member of [employer] or Organizations: Yes    Attends Engineer, structural: More than 4 times per year    Marital Status: Never married  Catering manager Violence: Not on file    Past Surgical History:  Procedure Laterality Date   CESAREAN SECTION      Family History  Problem Relation Age of Onset   Hypertension Mother    Diabetes type II Father    Breast cancer Paternal Grandmother    Cancer Paternal Aunt 57       breast   Breast cancer Paternal Aunt    Breast cancer Paternal Aunt    Breast cancer Paternal Aunt     No Known Allergies  No current outpatient medications on file prior to visit.   No current facility-administered medications on file prior to visit.    BP 118/70   Pulse 84   Temp 98.2 F (36.8 C) (Temporal)   Ht 5\' 5"  (1.651 m)   Wt 235 lb (106.6 kg)   LMP 08/09/2023   SpO2 96%   BMI 39.11 kg/m  Objective:   Physical Exam HENT:     Right Ear: Tympanic membrane and ear canal normal.     Left Ear: Tympanic membrane and ear canal normal.  Eyes:     Pupils: Pupils are equal, round, and reactive to light.  Cardiovascular:     Rate and Rhythm: Normal rate and regular rhythm.  Pulmonary:     Effort: Pulmonary effort is normal.     Breath sounds: Normal breath sounds.  Abdominal:     General: Bowel sounds are normal.     Palpations: Abdomen is soft.     Tenderness: There is no abdominal tenderness.   Musculoskeletal:        General: Normal range of motion.     Cervical back: Neck supple.  Skin:    General: Skin is warm and dry.  Neurological:     Mental Status: She is alert and oriented to person, place, and time.     Cranial Nerves: No cranial nerve deficit.     Deep Tendon Reflexes:     Reflex Scores:      Patellar reflexes are 2+ on the right side and 2+ on the left side. Psychiatric:  Mood and Affect: Mood normal.           Assessment & Plan:  Encounter for annual general medical examination with abnormal findings in adult Assessment & Plan: Immunizations UTD. Pap smear UTD.   Discussed the importance of a healthy diet and regular exercise in order for weight loss, and to reduce the risk of further co-morbidity.  Exam stable. Labs pending.  Follow up in 1 year for repeat physical.    Hyperlipidemia, unspecified hyperlipidemia type Assessment & Plan: Repeat lipid panel pending.  Discussed the importance of a healthy diet and regular exercise in order for weight loss, and to reduce the risk of further co-morbidity.   Orders: -     Hemoglobin A1c; Future -     Comprehensive metabolic panel with GFR; Future -     Lipid panel; Future  Migraine without aura and without status migrainosus, not intractable Assessment & Plan: Chronic, frequent.  Discussed options for treatment.  Start propranolol ER 80 mg at bedtime for headache prevention. Start sumatriptan 50 mg as needed for migraine abortion.  Discussed instructions for use.  Drowsiness precautions provided.  Continue to monitor.  Orders: -     Propranolol HCl ER; Take 1 capsule (80 mg total) by mouth at bedtime. For headache prevention  Dispense: 90 capsule; Refill: 0 -     SUMAtriptan Succinate; Take 1 tablet by mouth at migraine onset. May repeat in 2 hours if headache persists or recurs.  Dispense: 10 tablet; Refill: 0  Frequent headaches Assessment & Plan: Less likely trigeminal  neuralgia, will obtain sed rate and CBC.  Symptoms more likely migraines.   Discussed options for treatment.  Start propranolol ER 80 mg at bedtime for headache prevention. Start sumatriptan 50 mg as needed for migraine abortion.  Discussed instructions for use.  Drowsiness precautions provided.  Orders: -     CBC; Future -     Sedimentation rate; Future  Prediabetes Assessment & Plan: Repeat A1C pending.  Discussed the importance of a healthy diet and regular exercise in order for weight loss, and to reduce the risk of further co-morbidity.          Krystn Dermody K Keerthana Vanrossum, NP

## 2023-08-24 NOTE — Assessment & Plan Note (Signed)
Repeat lipid panel pending.  Discussed the importance of a healthy diet and regular exercise in order for weight loss, and to reduce the risk of further co-morbidity.  

## 2023-08-24 NOTE — Patient Instructions (Signed)
 Start propranolol ER 80 mg for headache/migraine prevention.  Take 1 capsule by mouth every evening at bedtime.  You may take sumatriptan (Imitrex) 50 mg tablets as needed for migraines.  Take 1 tablet at migraine onset.  You may take an additional tablet 2 hours later if needed.  Do not take more than 2 tablets in 24 hours.  This may cause drowsiness.  Schedule a lab only appointment to return fasting.  It was a pleasure to see you today!

## 2023-08-24 NOTE — Assessment & Plan Note (Signed)
Repeat A1C pending.  Discussed the importance of a healthy diet and regular exercise in order for weight loss, and to reduce the risk of further co-morbidity.  

## 2023-08-24 NOTE — Assessment & Plan Note (Signed)
Immunizations UTD. Pap smear UTD  Discussed the importance of a healthy diet and regular exercise in order for weight loss, and to reduce the risk of further co-morbidity.  Exam stable. Labs pending.  Follow up in 1 year for repeat physical.  

## 2023-08-30 ENCOUNTER — Other Ambulatory Visit (INDEPENDENT_AMBULATORY_CARE_PROVIDER_SITE_OTHER): Payer: PRIVATE HEALTH INSURANCE

## 2023-08-30 ENCOUNTER — Other Ambulatory Visit: Payer: Self-pay | Admitting: Primary Care

## 2023-08-30 ENCOUNTER — Ambulatory Visit: Payer: Self-pay | Admitting: Primary Care

## 2023-08-30 DIAGNOSIS — E785 Hyperlipidemia, unspecified: Secondary | ICD-10-CM

## 2023-08-30 DIAGNOSIS — R519 Headache, unspecified: Secondary | ICD-10-CM

## 2023-08-30 DIAGNOSIS — G4452 New daily persistent headache (NDPH): Secondary | ICD-10-CM

## 2023-08-30 DIAGNOSIS — R051 Acute cough: Secondary | ICD-10-CM

## 2023-08-30 LAB — COMPREHENSIVE METABOLIC PANEL WITH GFR
ALT: 16 U/L (ref 0–35)
AST: 15 U/L (ref 0–37)
Albumin: 4.3 g/dL (ref 3.5–5.2)
Alkaline Phosphatase: 74 U/L (ref 39–117)
BUN: 9 mg/dL (ref 6–23)
CO2: 24 meq/L (ref 19–32)
Calcium: 9.4 mg/dL (ref 8.4–10.5)
Chloride: 104 meq/L (ref 96–112)
Creatinine, Ser: 0.74 mg/dL (ref 0.40–1.20)
GFR: 101.97 mL/min (ref >60.00)
Glucose, Bld: 164 mg/dL — ABNORMAL HIGH (ref 70–99)
Potassium: 4.3 meq/L (ref 3.5–5.1)
Sodium: 137 meq/L (ref 135–145)
Total Bilirubin: 0.3 mg/dL (ref 0.2–1.2)
Total Protein: 7.4 g/dL (ref 6.0–8.3)

## 2023-08-30 LAB — LIPID PANEL
Cholesterol: 235 mg/dL — ABNORMAL HIGH (ref 0–200)
HDL: 43.4 mg/dL (ref >39.00)
LDL Cholesterol: 158 mg/dL — ABNORMAL HIGH (ref 0–99)
NonHDL: 191.35
Total CHOL/HDL Ratio: 5
Triglycerides: 169 mg/dL — ABNORMAL HIGH (ref 0.0–149.0)
VLDL: 33.8 mg/dL (ref 0.0–40.0)

## 2023-08-30 LAB — CBC
HCT: 38 % (ref 36.0–46.0)
Hemoglobin: 12.4 g/dL (ref 12.0–15.0)
MCHC: 32.7 g/dL (ref 30.0–36.0)
MCV: 83.6 fl (ref 78.0–100.0)
Platelets: 338 10*3/uL (ref 150.0–400.0)
RBC: 4.55 Mil/uL (ref 3.87–5.11)
RDW: 14.4 % (ref 11.5–15.5)
WBC: 5.9 10*3/uL (ref 4.0–10.5)

## 2023-08-30 LAB — HEMOGLOBIN A1C: Hgb A1c MFr Bld: 7.4 % — ABNORMAL HIGH (ref 4.6–6.5)

## 2023-08-30 LAB — SEDIMENTATION RATE: Sed Rate: 32 mm/h — ABNORMAL HIGH (ref 0–20)

## 2023-09-01 MED ORDER — ATORVASTATIN CALCIUM 10 MG PO TABS: 10.0000 mg | ORAL_TABLET | Freq: Every day | ORAL | 0 refills | Status: AC

## 2023-09-07 ENCOUNTER — Encounter: Payer: Self-pay | Admitting: *Deleted

## 2023-09-13 ENCOUNTER — Telehealth: Payer: Self-pay | Admitting: Primary Care

## 2023-09-13 NOTE — Telephone Encounter (Signed)
 Copied from CRM [id number]. Topic: General - Other >> Sep 13, 2023 10:19 AM Aleatha C wrote: Reason for CRM: Sueanne from [facility] would like a pre authorization for  tomorrows appointment phone [phone] ext [phone]

## 2023-09-14 ENCOUNTER — Ambulatory Visit: Payer: PRIVATE HEALTH INSURANCE

## 2023-09-17 ENCOUNTER — Telehealth: Payer: Self-pay

## 2023-09-17 NOTE — Telephone Encounter (Signed)
 Copied from CRM [id number]. Topic: Clinical - Request for Lab/Test Order >> Sep 17, 2023 10:46 AM Mercedes MATSU wrote: Reason for CRM: Patient is requesting the order for CT be sent over to [facility]  Fax: [phone]

## 2023-09-17 NOTE — Telephone Encounter (Signed)
 Spoke with patient, she was referring to MRI Brain WO Contrast. Faxed order to [facility] per patient request.

## 2023-09-21 ENCOUNTER — Ambulatory Visit: Payer: PRIVATE HEALTH INSURANCE

## 2023-09-25 ENCOUNTER — Ambulatory Visit
Admission: RE | Admit: 2023-09-25 | Discharge: 2023-09-25 | Disposition: A | Discharge status: Home / Self Care | Payer: PRIVATE HEALTH INSURANCE | Source: Ambulatory Visit | Attending: Primary Care

## 2023-09-25 ENCOUNTER — Ambulatory Visit: Payer: Self-pay | Admitting: Primary Care

## 2023-09-25 DIAGNOSIS — G4452 New daily persistent headache (NDPH): Secondary | ICD-10-CM

## 2023-10-06 ENCOUNTER — Other Ambulatory Visit: Payer: PRIVATE HEALTH INSURANCE

## 2023-10-28 ENCOUNTER — Encounter: Payer: Self-pay | Admitting: Primary Care

## 2023-10-28 ENCOUNTER — Telehealth: Payer: PRIVATE HEALTH INSURANCE | Admitting: Primary Care

## 2023-10-28 DIAGNOSIS — J069 Acute upper respiratory infection, unspecified: Secondary | ICD-10-CM

## 2023-10-28 NOTE — Progress Notes (Signed)
 Patient ID: Jillian Contreras Pounds, female    DOB: Aug 11, 1985, 38 y.o.   MRN: [id number]  Virtual visit completed through caregility, a video enabled telemedicine application. Due to national recommendations of social distancing due to COVID-19, a virtual visit is felt to be most appropriate for this patient at this time. Reviewed limitations, risks, security and privacy concerns of performing a virtual visit and the availability of in person appointments. I also reviewed that there may be a patient responsible charge related to this service. The patient agreed to proceed.   Patient location: home Provider location: [place] at [facility], office Persons participating in this virtual visit: patient, provider   If any vitals were documented, they were collected by patient at home unless specified below.    There were no vitals taken for this visit.   CC: URI symtptoms Subjective:   HPI: Jillian Contreras is a 38 y.o. female with a history of migraines, GERD, prediabetes presenting on 10/28/2023 for Sore Throat (Tuesday evening symptoms began /Sore throat, headache, body aches. /Did not do at home testing /Requesting work note to be excused out of work today. /)  Symptom onset two days ago with sore throat, body aches, headaches. She's then developed post nasal drip, decreased appetite, sinus pressure and dizziness. She denies fevers, cough.   She's taken Theraflu once. She has not tested for Covid-19 injections. She worked from home half of a day yesterday, has been out today. She has tomorrow off for vacation.       Relevant past medical, surgical, family and social history reviewed and updated as indicated. Interim medical history since our last visit reviewed. Allergies and medications reviewed and updated. Outpatient Medications Prior to Visit  Medication Sig Dispense Refill   atorvastatin  (LIPITOR) 10 MG tablet Take 1 tablet (10 mg total) by mouth daily. for cholesterol. (Patient not taking:  Reported on 10/28/2023) 90 tablet 0   propranolol  ER (INDERAL  LA) 80 MG 24 hr capsule Take 1 capsule (80 mg total) by mouth at bedtime. For headache prevention (Patient not taking: Reported on 10/28/2023) 90 capsule 0   SUMAtriptan  (IMITREX ) 50 MG tablet Take 1 tablet by mouth at migraine onset. May repeat in 2 hours if headache persists or recurs. (Patient not taking: Reported on 10/28/2023) 10 tablet 0   No facility-administered medications prior to visit.     Per HPI unless specifically indicated in ROS section below Review of Systems  Constitutional:  Positive for fatigue. Negative for fever.  HENT:  Positive for postnasal drip and sinus pressure.   Respiratory:  Negative for cough and chest tightness.   Neurological:  Positive for headaches.   Objective:  There were no vitals taken for this visit.  Wt Readings from Last 3 Encounters:  08/24/23 235 lb (106.6 kg)  06/08/23 233 lb (105.7 kg)  05/21/23 230 lb (104.3 kg)       Physical exam: General: Alert and oriented x 3, no distress, does appear sickly  Pulmonary: Speaks in complete sentences without increased work of breathing, no cough during visit.  Psychiatric: Normal mood, thought content, and behavior.     Results for orders placed or performed in visit on 08/30/23  Sedimentation rate   Collection Time: 08/30/23  7:44 AM  Result Value Ref Range   Sed Rate 32 (H) 0 - 20 mm/hr  CBC   Collection Time: 08/30/23  7:44 AM  Result Value Ref Range   WBC 5.9 4.0 - 10.5 K/uL  RBC 4.55 3.87 - 5.11 Mil/uL   Platelets 338.0 150.0 - 400.0 K/uL   Hemoglobin 12.4 12.0 - 15.0 g/dL   HCT 61.9 63.9 - 53.9 %   MCV 83.6 78.0 - 100.0 fl   MCHC 32.7 30.0 - 36.0 g/dL   RDW 85.5 88.4 - 84.4 %  Lipid panel   Collection Time: 08/30/23  7:44 AM  Result Value Ref Range   Cholesterol 235 (H) 0 - 200 mg/dL   Triglycerides 830.9 (H) 0.0 - 149.0 mg/dL   HDL 56.59 >60.99 mg/dL   VLDL 66.1 0.0 - 59.9 mg/dL   LDL Cholesterol 841 (H) 0 - 99  mg/dL   Total CHOL/HDL Ratio 5    NonHDL 191.35   Comprehensive metabolic panel with GFR   Collection Time: 08/30/23  7:44 AM  Result Value Ref Range   Sodium 137 135 - 145 mEq/L   Potassium 4.3 3.5 - 5.1 mEq/L   Chloride 104 96 - 112 mEq/L   CO2 24 19 - 32 mEq/L   Glucose, Bld 164 (H) 70 - 99 mg/dL   BUN 9 6 - 23 mg/dL   Creatinine, Ser 9.25 0.40 - 1.20 mg/dL   Total Bilirubin 0.3 0.2 - 1.2 mg/dL   Alkaline Phosphatase 74 39 - 117 U/L   AST 15 0 - 37 U/L   ALT 16 0 - 35 U/L   Total Protein 7.4 6.0 - 8.3 g/dL   Albumin 4.3 3.5 - 5.2 g/dL   GFR 898.02 >39.99 mL/min   Calcium  9.4 8.4 - 10.5 mg/dL  Hemoglobin J8r   Collection Time: 08/30/23  7:44 AM  Result Value Ref Range   Hgb A1c MFr Bld 7.4 (H) 4.6 - 6.5 %   Assessment & Plan:   Problem List Items Addressed This Visit       Respiratory   Viral URI - Primary   Symptoms and presentation suggestive of viral etiology at this point. Cannot rule out COVID-19 infection. She appears stable for outpatient treatment at this point.  We discussed options for treatment. Work note provided.  Return precautions provided.        No orders of the defined types were placed in this encounter.  No orders of the defined types were placed in this encounter.   I discussed the assessment and treatment plan with the patient. The patient was provided an opportunity to ask questions and all were answered. The patient agreed with the plan and demonstrated an understanding of the instructions. The patient was advised to call back or seek an in-person evaluation if the symptoms worsen or if the condition fails to improve as anticipated.  Follow up plan:  You can try a few things over the counter to help with your symptoms including:  Cough: Delsym or Robitussin (get the off brand, works just as well) Chest Congestion: Mucinex  (plain) Nasal Congestion/Ear Pressure/Sinus Pressure: Try using Flonase (fluticasone) nasal spray. Instill 1  spray in each nostril twice daily. This can be purchased over the counter. Body aches, fevers, headache: Ibuprofen (not to exceed 2400 mg in 24 hours) or Acetaminophen-Tylenol (not to exceed 3000 mg in 24 hours) Runny Nose/Throat Drainage/Sneezing/Itchy or Watery Eyes: An antihistamine such as Zyrtec, Claritin, Xyzal, Allegra  You should be feeling better by day seven of symptoms, but please do schedule an appointment if this is not the case.  .It was a pleasure to see you today!   Erin Uecker K Shi Blankenship, NP

## 2023-10-28 NOTE — Patient Instructions (Signed)
 You can try a few things over the counter to help with your symptoms including:  Cough: Delsym or Robitussin (get the off brand, works just as well) Chest Congestion: Mucinex  (plain) Nasal Congestion/Ear Pressure/Sinus Pressure: Try using Flonase (fluticasone) nasal spray. Instill 1 spray in each nostril twice daily. This can be purchased over the counter. Body aches, fevers, headache: Ibuprofen (not to exceed 2400 mg in 24 hours) or Acetaminophen-Tylenol (not to exceed 3000 mg in 24 hours) Runny Nose/Throat Drainage/Sneezing/Itchy or Watery Eyes: An antihistamine such as Zyrtec, Claritin, Xyzal, Allegra  You should be feeling better by day seven of symptoms, but please do schedule an appointment if this is not the case.  .It was a pleasure to see you today!

## 2023-10-28 NOTE — Assessment & Plan Note (Signed)
 Symptoms and presentation suggestive of viral etiology at this point. Cannot rule out COVID-19 infection. She appears stable for outpatient treatment at this point.  We discussed options for treatment. Work note provided.  Return precautions provided.

## 2023-11-02 ENCOUNTER — Telehealth: Payer: PRIVATE HEALTH INSURANCE | Admitting: Family Medicine

## 2023-11-02 ENCOUNTER — Ambulatory Visit: Payer: Self-pay

## 2023-11-02 ENCOUNTER — Ambulatory Visit: Payer: PRIVATE HEALTH INSURANCE | Admitting: Family Medicine

## 2023-11-02 ENCOUNTER — Encounter: Payer: Self-pay | Admitting: Family Medicine

## 2023-11-02 VITALS — Ht 65.0 in | Wt 227.0 lb

## 2023-11-02 DIAGNOSIS — J019 Acute sinusitis, unspecified: Secondary | ICD-10-CM

## 2023-11-02 MED ORDER — GUAIFENESIN-CODEINE 100-10 MG/5ML PO SOLN: 5.0000 mL | Freq: Two times a day (BID) | ORAL | 0 refills | Status: AC | PRN

## 2023-11-02 MED ORDER — AMOXICILLIN-POT CLAVULANATE 875-125 MG PO TABS: 1.0000 | ORAL_TABLET | Freq: Two times a day (BID) | ORAL | 0 refills | Status: AC

## 2023-11-02 NOTE — Assessment & Plan Note (Signed)
 Anticipate bacterial given duration and progression of symptoms.  I did recommend she proceed with COVID swab, to use face mask until after 10 days of illness, or until COVID swab returns negative. Rx augmentin  10d course, may stop after 7 days if symptoms resolved.  Reviewed supportive measures with OTC remedies including vitamin D, C, zinc, start flonase and ibuprofen PRN.  Update if not improving with treatment. Pt agrees with plan.

## 2023-11-02 NOTE — Progress Notes (Signed)
 Ph: [phone] Fax: [phone]   Patient ID: Jillian Contreras, female    DOB: [birth year], 38 y.o.   MRN: [id number]  Virtual visit completed through MyChart, a video enabled telemedicine application. Due to national recommendations of social distancing due to COVID-19, a virtual visit is felt to be most appropriate for this patient at this time. Reviewed limitations, risks, security and privacy concerns of performing a virtual visit and the availability of in person appointments. I also reviewed that there may be a patient responsible charge related to this service. The patient agreed to proceed.   Patient location: home at work in office Provider location: Adult nurse at [facility], office Persons participating in this virtual visit: patient, provider   If any vitals were documented, they were collected by patient at home unless specified below.    Ht 5' 5 (1.651 m)   Wt 227 lb (103 kg) Comment: per pt  BMI 37.77 kg/m    CC: progressive URI symptoms  Subjective:   HPI: Jillian Contreras is a 38 y.o. female presenting on 11/02/2023 for Sore Throat (With body aches stared a week ago. Over the weekend started with cough and sore throat and congestion. Not sure if any fever but has had headaches. )   Saw PCP last week with viral URI with recommendation for OTC cold remedy treatment.  Symptoms started 10/26/2023 - ST, body aches, headache, PNDrainage, anorexia, sinus pressure and dizziness.   Cough started on Friday, progressive ST worsening attributed to coughing, congestion with purulent mucous, off and on facial pressure (frontal and maxillary) with sinus headache, unsure about fever but had sweating and chills last night.  Throat is very red but no white patches.  Cough is keeping her up at night.   No chest pain, chest pain, palpitations, abd pain, nausea, diarrhea.   She has not checked COVID test.  She is back at work yesterday and today - on vacation the rest of the week.    Has tried tylenol sinus/severe for headache, robitussin DM for cough.  Tessalon  perls don't help.      Relevant past medical, surgical, family and social history reviewed and updated as indicated. Interim medical history since our last visit reviewed. Allergies and medications reviewed and updated. Outpatient Medications Prior to Visit  Medication Sig Dispense Refill   atorvastatin  (LIPITOR) 10 MG tablet Take 1 tablet (10 mg total) by mouth daily. for cholesterol. (Patient not taking: Reported on 11/02/2023) 90 tablet 0   propranolol  ER (INDERAL  LA) 80 MG 24 hr capsule Take 1 capsule (80 mg total) by mouth at bedtime. For headache prevention (Patient not taking: Reported on 11/02/2023) 90 capsule 0   SUMAtriptan  (IMITREX ) 50 MG tablet Take 1 tablet by mouth at migraine onset. May repeat in 2 hours if headache persists or recurs. (Patient not taking: Reported on 11/02/2023) 10 tablet 0   No facility-administered medications prior to visit.     Per HPI unless specifically indicated in ROS section below Review of Systems Objective:  Ht 5' 5 (1.651 m)   Wt 227 lb (103 kg) Comment: per pt  BMI 37.77 kg/m   Wt Readings from Last 3 Encounters:  11/02/23 227 lb (103 kg)  08/24/23 235 lb (106.6 kg)  06/08/23 233 lb (105.7 kg)       Physical exam: Gen: alert, NAD, not ill appearing Pulm: speaks in complete sentences without increased work of breathing Psych: normal mood, normal thought content      Lab  Results  Component Value Date   HGBA1C 7.4 (H) 08/30/2023    Assessment & Plan:   Acute non-recurrent sinusitis, unspecified location Assessment & Plan: Anticipate bacterial given duration and progression of symptoms.  I did recommend she proceed with COVID swab, to use face mask until after 10 days of illness, or until COVID swab returns negative. Rx augmentin  10d course, may stop after 7 days if symptoms resolved.  Reviewed supportive measures with OTC remedies including vitamin  D, C, zinc, start flonase and ibuprofen PRN.  Update if not improving with treatment. Pt agrees with plan.    Other orders -     Amoxicillin -Pot Clavulanate; Take 1 tablet by mouth 2 (two) times daily for 10 days.  Dispense: 20 tablet; Refill: 0 -     guaiFENesin -Codeine ; Take 5 mLs by mouth 2 (two) times daily as needed for cough (sedation precautions).  Dispense: 120 mL; Refill: 0     I discussed the assessment and treatment plan with the patient. The patient was provided an opportunity to ask questions and all were answered. The patient agreed with the plan and demonstrated an understanding of the instructions. The patient was advised to call back or seek an in-person evaluation if the symptoms worsen or if the condition fails to improve as anticipated.  Follow up plan: No follow-ups on file.  Anton Blas, MD

## 2023-11-02 NOTE — Telephone Encounter (Signed)
 FYI Only or Action Required?: FYI only for provider.  Patient was last seen in primary care on 10/28/2023 by Gretta Comer POUR, NP.  Called Nurse Triage reporting Cough and Sore Throat.  Symptoms began several days ago.  Interventions attempted: OTC medications: flonase, Prescription medications: cold , and Rest, hydration, or home remedies.  Symptoms are: gradually worsening.  Triage Disposition: See Physician Within 24 Hours  Patient/caregiver understands and will follow disposition?: Yes   Copied from CRM [id number]. Topic: Clinical - Red Word Triage >> Nov 02, 2023 12:42 PM Winona SAUNDERS wrote: Pt last seen virtually Aug 7th for same symptoms and it has not gotten better.  Symptoms include, sore throat, Cough, and congestion Reason for Disposition  [1] Continuous (nonstop) coughing interferes with work or school AND [2] no improvement using cough treatment per Care Advice  Answer Assessment - Initial Assessment Questions 1. ONSET: When did the cough begin?      Friday 2. SEVERITY: How bad is the cough today?      moderate 3. SPUTUM: Describe the color of your sputum (e.g., none, dry cough; clear, white, yellow, green)     Dry now 4. HEMOPTYSIS: Are you coughing up any blood? If Yes, ask: How much? (e.g., flecks, streaks, tablespoons, etc.)     denies 5. DIFFICULTY BREATHING: Are you having difficulty breathing? If Yes, ask: How bad is it? (e.g., mild, moderate, severe)      no 6. FEVER: Do you have a fever? If Yes, ask: What is your temperature, how was it measured, and when did it start?     Denies but sweating at night 7. CARDIAC HISTORY: Do you have any history of heart disease? (e.g., heart attack, congestive heart failure)       8. LUNG HISTORY: Do you have any history of lung disease?  (e.g., pulmonary embolus, asthma, emphysema)      9. PE RISK FACTORS: Do you have a history of blood clots? (or: recent major surgery, recent prolonged travel,  bedridden)      10. OTHER SYMPTOMS: Do you have any other symptoms? (e.g., runny nose, wheezing, chest pain) Runny nose, congestion, sore throat.  Additional info: Patient requested video visit today due to being at work, boss allowing time for video visit but unable to leave for in person evaluation.  Protocols used: Cough - Acute Productive-A-AH

## 2023-11-02 NOTE — Telephone Encounter (Signed)
 Noted

## 2023-11-14 ENCOUNTER — Other Ambulatory Visit: Payer: Self-pay | Admitting: Primary Care

## 2023-11-14 DIAGNOSIS — G43009 Migraine without aura, not intractable, without status migrainosus: Secondary | ICD-10-CM
